# Patient Record
Sex: Male | Born: 1940 | Race: White | Hispanic: No | Marital: Married | Smoking: Former smoker
Health system: Southern US, Community
[De-identification: ages and names within clinical notes are randomized; demographics above are authoritative.]

## PROBLEM LIST (undated history)

## (undated) DIAGNOSIS — D3502 Benign neoplasm of left adrenal gland: Secondary | ICD-10-CM

## (undated) DIAGNOSIS — R918 Other nonspecific abnormal finding of lung field: Secondary | ICD-10-CM

## (undated) DIAGNOSIS — N400 Enlarged prostate without lower urinary tract symptoms: Secondary | ICD-10-CM

## (undated) DIAGNOSIS — I2510 Atherosclerotic heart disease of native coronary artery without angina pectoris: Secondary | ICD-10-CM

## (undated) DIAGNOSIS — I10 Essential (primary) hypertension: Secondary | ICD-10-CM

## (undated) DIAGNOSIS — I639 Cerebral infarction, unspecified: Secondary | ICD-10-CM

## (undated) DIAGNOSIS — C679 Malignant neoplasm of bladder, unspecified: Secondary | ICD-10-CM

## (undated) DIAGNOSIS — E119 Type 2 diabetes mellitus without complications: Secondary | ICD-10-CM

## (undated) DIAGNOSIS — E782 Mixed hyperlipidemia: Secondary | ICD-10-CM

## (undated) HISTORY — DX: Benign prostatic hyperplasia without lower urinary tract symptoms: N40.0

## (undated) HISTORY — DX: Benign neoplasm of left adrenal gland: D35.02

## (undated) HISTORY — DX: Type 2 diabetes mellitus without complications: E11.9

## (undated) HISTORY — PX: ABDOMINAL AORTIC ANEURYSM REPAIR: SUR1152

## (undated) HISTORY — DX: Essential (primary) hypertension: I10

## (undated) HISTORY — PX: HERNIA REPAIR: SHX51

## (undated) HISTORY — DX: Atherosclerotic heart disease of native coronary artery without angina pectoris: I25.10

## (undated) HISTORY — DX: Malignant neoplasm of bladder, unspecified: C67.9

## (undated) HISTORY — DX: Mixed hyperlipidemia: E78.2

## (undated) HISTORY — DX: Cerebral infarction, unspecified: I63.9

## (undated) HISTORY — PX: OTHER SURGICAL HISTORY: SHX169

## (undated) HISTORY — DX: Other nonspecific abnormal finding of lung field: R91.8

## (undated) HISTORY — PX: APPENDECTOMY: SHX54

---

## 2001-10-30 ENCOUNTER — Inpatient Hospital Stay (HOSPITAL_COMMUNITY): Admission: EM | Admit: 2001-10-30 | Discharge: 2001-10-30 | Payer: Self-pay | Admitting: Emergency Medicine

## 2001-10-30 ENCOUNTER — Encounter: Payer: Self-pay | Admitting: Emergency Medicine

## 2006-06-08 ENCOUNTER — Emergency Department (HOSPITAL_COMMUNITY): Admission: EM | Admit: 2006-06-08 | Discharge: 2006-06-08 | Payer: Self-pay | Admitting: Emergency Medicine

## 2011-07-17 HISTORY — PX: CORONARY ARTERY BYPASS GRAFT: SHX141

## 2011-08-14 ENCOUNTER — Ambulatory Visit (HOSPITAL_COMMUNITY)
Admission: RE | Admit: 2011-08-14 | Discharge: 2011-08-14 | Disposition: A | Discharge status: Home / Self Care | Payer: Medicare Other | Source: Ambulatory Visit | Attending: Family Medicine | Admitting: Family Medicine

## 2011-08-14 DIAGNOSIS — R29898 Other symptoms and signs involving the musculoskeletal system: Secondary | ICD-10-CM | POA: Insufficient documentation

## 2011-08-14 DIAGNOSIS — Z5189 Encounter for other specified aftercare: Secondary | ICD-10-CM | POA: Insufficient documentation

## 2011-08-14 DIAGNOSIS — I69998 Other sequelae following unspecified cerebrovascular disease: Secondary | ICD-10-CM | POA: Insufficient documentation

## 2011-08-14 DIAGNOSIS — R262 Difficulty in walking, not elsewhere classified: Secondary | ICD-10-CM | POA: Insufficient documentation

## 2011-08-14 DIAGNOSIS — I69959 Hemiplegia and hemiparesis following unspecified cerebrovascular disease affecting unspecified side: Secondary | ICD-10-CM | POA: Insufficient documentation

## 2011-08-14 DIAGNOSIS — I69919 Unspecified symptoms and signs involving cognitive functions following unspecified cerebrovascular disease: Secondary | ICD-10-CM | POA: Insufficient documentation

## 2011-08-14 DIAGNOSIS — Z951 Presence of aortocoronary bypass graft: Secondary | ICD-10-CM | POA: Insufficient documentation

## 2011-08-14 NOTE — Evaluation (Signed)
Occupational Therapy Evaluation  Patient Details  Name: Allen Mason MRN: [id number] Date of Birth: 1940-08-01  Today's Date: 08/14/2011 Time: 1430-1530 Time Calculation (min): 60 min  Visit#: 1  of 8   Re-eval: 09/11/11  Assessment Diagnosis: CVA s/p CABG, Next MD Visit: 08/15/11 Prior Therapy: Acute PT, OT ST  Past Medical History: No past medical history on file. Past Surgical History: No past surgical history on file.  Subjective Symptoms/Limitations Symptoms: Since coming home from the hospital patient complaining of being very cold, and things do not taste well. Patient now walks with a cane at home and has decreased activitiy tolerance. At times his knees buckle.Patient's left arm is weak and uncoordinated. Limitations: Patient states he had a heart attack and on Jan. 22, 2013 Mr. 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  Patient developed Pleural Effusion with 2 L of fluid drained from a chest tube placed 2/1. Chest tube removed 2/9 and he is going to possibly undergo another aspiration of fluid 08/15/11. Patient seen in acute care 2/8 and home 08/10/11. Pain Assessment Currently in Pain?: No/denies Multiple Pain Sites: No  Precautions/Restrictions  Precautions Precautions: Fall Precaution Comments: can not take tub bath, no raising over 10 lbs, no raising shoulder over head CABG precauttions. Restrictions Weight Bearing Restrictions: No  Prior Functioning  Home Living Lives With: Spouse Receives Help From: Family Type of Home: House Home Layout: Two level;Able to live on main level with bedroom/bathroom Alternate Level Stairs-Rails: Left Alternate Level Stairs-Number of Steps: 5 Home Access: Stairs to enter Entrance Stairs-Rails: Left Entrance Stairs-Number of Steps: 5 Bathroom Shower/Tub: Chiropodist:  Standard Bathroom Accessibility: No Home Adaptive Equipment: None Prior Function Level of Independence: Independent with basic ADLs;Independent with homemaking with ambulation;Independent with gait;Independent with transfers Able to Take Stairs?: Yes Driving: Yes Vocation: Retired Biomedical scientist: retired railroad Leisure: Hobbies-yes (Comment) Comments: Piddling  Assessment ADL/Vision/Perception ADL ADL Comments: Patient has problems reading the paper, Has problems with memory retention of reading, STM problems, Patient very frustrated about his cognitive deficits. Vision - History Baseline Vision: Bifocals Visual History: Cataracts Patient Visual Report: No change from baseline Vision - Assessment Eye Alignment: Within Functional Limits Vision Assessment: Vision not tested Perception Perception: Not tested Praxis Praxis: Intact  Cognition/Observation Cognition Overall Cognitive Status: Appears within functional limits for tasks assessed Arousal/Alertness: Awake/alert Orientation Level: Oriented X4 Attention: Sustained Sustained Attention: Appears intact Memory: Impaired Memory Impairment: Decreased short term memory Decreased Short Term Memory: Functional basic Awareness: Appears intact Problem Solving: Appears intact Behaviors: Poor frustration tolerance Safety/Judgment: Impaired  Sensation/Coordination/Edema Sensation Light Touch: Appears Intact Stereognosis: Appears Intact Hot/Cold: Appears Intact Proprioception: Impaired Detail Coordination Gross Motor Movements are Fluid and Coordinated: No Fine Motor Movements are Fluid and Coordinated: No Coordination and Movement Description: decreased coordnination notes in Left arm Edema Edema: none noted  Additional Assessments RUE Assessment RUE Assessment: Within Functional Limits LUE Assessment LUE Assessment: Exceptions to WFL LUE AROM (degrees) Overall AROM Left Upper Extremity: Within functional  limits for tasks assessed LUE PROM (degrees) Overall PROM Left Upper Extremity: Within functional limits for tasks assessed LUE Strength LUE Overall Strength: Within Functional Limits for tasks assessed Grip (lbs): 58 lbs Lateral Pinch: 12 lbs 3 Point Pinch: 10 lbs  Exercise/Treatments Patient taken through coordinated use of LUE to manipulate putting and strengthen grip and pinch.   Occupational  Therapy Assessment and Plan OT Assessment and Plan Clinical Impression Statement: A: Patient is a good candidate for OT to work on ADL's, fine motor coordination, STM skills and perceptual skills as well as increased activity tolerance. Rehab Potential: Excellent OT Frequency: Min 2X/week OT Duration: 4 weeks OT Treatment/Interventions: Self-care/ADL training;Therapeutic exercise;Therapeutic activities;Manual therapy;Patient/family education OT Plan: P: Skilled OT required to increase FM coordianation, STM for ADL's, Activity tolerance and Safety.   Goals Home Exercise Program Pt will Perform Home Exercise Program: Independently Short Term Goals Time to Complete Short Term Goals: 2 weeks Short Term Goal 1: Patient will demonstrate indepednt use of theraputty to increase grip, pinch and coordination Short Term Goal 2: Patient will given a test of vusual perceptual skills. Short Term Goal 3: Patient will increase LUE Coordnination with ability to throw and catch balls of various sizes, shapes and weight with bilateral and unilateral (LUE) use. Short Term Goal 4: Patient willdemonstrate ability to complete all fasteners independently with use of bilateral hand use. Short Term Goal 5: Patient will be able to hold and read newspaper. Long Term Goals Time to Complete Long Term Goals: 4 weeks Long Term Goal 1: Patient will demonstrate 29 second 9 hold peg test with LUE hand Long Term Goal 2: Patient will be able to return to full use of television including knowing how to utilize readers  guide. Long Term Goal 3: Patient will be able to follow simple sequence for peparation of light meal. Long Term Goal 4: Patient will be able to sustain light physical activity over 30 minutes without a need for a break and no shortness of breath. Long Term Goal 5: Patient will be independent with ADL's IADL's at home with his wife   Problem List There is no problem list on file for this patient.   End of Session Activity Tolerance: Patient tolerated treatment well General Behavior During Session: [facility] for tasks performed Cognition: [facility] for tasks performed    Thomasenia Sales 08/14/2011, 3:51 PM  Physician Documentation Your signature is required to indicate approval of the treatment plan as stated above.  Please sign and either send electronically or make a copy of this report for your files and return this physician signed original.  Please mark one 1.__approve of plan  2. ___approve of plan with the following conditions.   ______________________________                                                          _____________________ Physician Signature                                                                                                             Date

## 2011-08-16 ENCOUNTER — Ambulatory Visit (HOSPITAL_COMMUNITY)
Admission: RE | Admit: 2011-08-16 | Discharge: 2011-08-16 | Disposition: A | Discharge status: Home / Self Care | Payer: Medicare Other | Source: Ambulatory Visit | Attending: Family Medicine | Admitting: Family Medicine

## 2011-08-16 ENCOUNTER — Ambulatory Visit (HOSPITAL_COMMUNITY)
Admission: RE | Admit: 2011-08-16 | Discharge: 2011-08-16 | Disposition: A | Discharge status: Home / Self Care | Payer: Medicare Other | Source: Ambulatory Visit | Attending: *Deleted | Admitting: *Deleted

## 2011-08-16 DIAGNOSIS — R262 Difficulty in walking, not elsewhere classified: Secondary | ICD-10-CM | POA: Insufficient documentation

## 2011-08-16 NOTE — Evaluation (Signed)
Physical Therapy Evaluation  Patient Details  Name: Allen Mason MRN: [id number] Date of Birth: 01/30/1941  Today's Date: 08/16/2011 Time: 1107-1202 Time Calculation (min): 55 min Charges: 1 eval; 10 min TE Visit#: 1  of 4   Re-eval: 09/15/11 Assessment Diagnosis: S/P CABG w/CVA Prior Therapy: inpatient rehab at [facility]  Subjective Symptoms/Limitations Symptoms: Pt is a 71 year old male that is referred to PT secondary to a CVA after sustaining a CABG w/ LLE weakness and fatigue.  His wife is present with him to report an accurate history.  CABG on 07/21/11 and then had a a CVA.  He was admitted to inpatient rehab and was discharged this past weekend.  When he was at home on Saturday he states he was walking outside and tripped over his L foot and fell backwards, but states he does not have any injury other then sore BUE.  He has a visible abrasion on his L forarm.  He reports he is currently functioning at about 60% of what he was a year ago.  Prior to his surgery he reports he enjoyed going to baseball games, working in his shop, able to drive and go out in the community without fatigue.  History of: DM, Heart Problems, hyperlipidema, GERD.   How long can you stand comfortably?: 3 minutes comfortably, reports he could stand longer if he had too.  Pt able to complete most activities today, however by the end of the treatment session he was too fatigued to continue with low level balance exercises.  How long can you walk comfortably?: 3 minutes with a SPC in the community.  He reports that he does not use his cane in his house.   Pain Assessment Currently in Pain?: No/denies  Precautions/Restrictions  Precautions Precaution Comments: NO MODALITIES per MD request  Prior Functioning  Prior Function Able to Take Stairs?: Reciprically Driving: Yes  Sensation/Coordination/Flexibility/Functional Tests Functional Tests Functional Tests: 6 minute walk test: completed throught the hospital  without rest breaks ( over 1000 ft completed).  Able to ascend and descend stairs with recipriocal pattern and 1 handrail.   Assessment RLE Assessment RLE Assessment: Within Functional Limits LLE Strength LLE Overall Strength Comments: 4/5 overall  Palpation Palpation: Moderate atrophy noted to LLE  Mobility/Balance  Ambulation/Gait Ambulation/Gait: Yes Assistive device: Straight cane (Carries in his hand, does not use it) Stairs: Yes Stairs Assistance: 6: Modified independent (Device/Increase time) Stair Management Technique: One rail Left Number of Stairs: 8  Height of Stairs: 6  Posture/Postural Control Posture/Postural Control: No significant limitations Berg Balance Test: 53/56 Sit To Stand: Able to stand without using hands Standing Unsupported: Able to stand safely 2 minutes Sitting with Back Unsupported but Feet Supported on Floor or Stool: Able to sit safely and securely 2 minutes Stand to Sit: Sits safely with minimal use of hands Transfers: Able to transfer safely, minor use of hands Standing Unsupported with Eyes Closed: Able to stand 10 seconds safely Standing Ubsupported with Feet Together: Able to place feet together independently and stand 1 minute safely From Standing, Reach Forward with Outstretched Arm: Can reach confidently >25 cm (10") From Standing Position, Pick up Object from Floor: Able to pick up shoe safely and easily From Standing Position, Turn to Look Behind Over each Shoulder: Looks behind from both sides and weight shifts well Turn 360 Degrees: Able to turn 360 degrees safely in 4 seconds or less Standing Unsupported, Alternately Place Feet on Step/Stool: Able to stand independently and safely and complete 8  steps in 20 seconds Standing Unsupported, One Foot in Front: Able to plae foot ahead of the other independently and hold 30 seconds Standing on One Leg: Able to lift leg independently and hold equal to or more than 3 seconds    Exercise/Treatments Standing Heel Raises: 10 reps;Limitations Heel Raises Limitations: Toe Raises 5x Functional Squat: 10 reps SLS: Education on SLS and Tandem stance for home, pt had decreased energy to continue with these exercises today. Seated Other Seated Knee Exercises: Heel and Toe Roll in and outs 5x5 sec hold eac Supine Hip Adduction Isometric: Right;Left;10 reps;Limitations Hip Adduction Isometric Limitations: SEATED 10 sec hold  Physical Therapy Assessment and Plan PT Assessment and Plan Clinical Impression Statement: Pt is a 71 year old male referred to PT s/p CABG and CVA which left him with L sided weakness and decreased endurance.  His c/co is weakness throughout is LUE and increased fatigue with activities.  After examination it was found that he has current impairments of mild decrease in L LE strength, impaired endurance and decreased upper level balance activities which are limiting his ability to participate in community related activities.  Pt will benefit from 4 visits of skilled OPPT in order to address the above impairments in order to maximize function and reach functional goals.  Rehab Potential: Good PT Frequency: Min 1X/week PT Duration: 4 weeks PT Treatment/Interventions: Gait training;Stair training;Functional mobility training;Therapeutic activities;Therapeutic exercise;Balance training;Patient/family education;Other (comment) (NO MODALITIES per MD) PT Plan: Add: Elliptical trainer for endurance (he has a TM at home).  Heel and Toe walking, 4 Way SLR, Hay Bails.    Goals Home Exercise Program Pt will Perform Home Exercise Program: Independently PT Goal: Perform Home Exercise Program - Progress: Goal set today PT Short Term Goals Time to Complete Short Term Goals: 4 weeks PT Short Term Goal 1: Pt will report ambulating comfortably for 15 minutes in order to participate in community related activities.  PT Short Term Goal 2: Pt will improve his dynamic  balance demonstrate ambulating independently on outdoor surfaces (grass, asphalt and concrete) for 10 minutes to decrease risk of falls when he attends outdoor sporting events.   Problem List Patient Active Problem List  Diagnoses  . Difficulty in walking    PT - End of Session Activity Tolerance: Patient limited by fatigue;Patient tolerated treatment well PT Plan of Care PT Home Exercise Plan: See scanned report.  Also instructed on Walking Program and how to decrease his risks of falls.  Consulted and Agree with Plan of Care: Patient;Family member/caregiver Family Member Consulted: Wife (sandra)  GP  Functional Reporting Modifier  Current Status  [id number] - Mobility: Walking & Moving Around CI - At least 20% but less than 40% impaired, limited or restricted  Goal Status  G8979 - Mobility: [place] & Moving Around CI - At least 1% but less than 20% impaired, limited or restricted  Ratings based on: Pt reports he is only able to comfortably ambulate for 3 minutes and becomes easily fatigued.  As well as MMT (4/5 LLE) and balance findings (Berg: 53/56) and pt report of functioning at 60% of his normal.   Monquie Fulgham 08/16/2011, 12:30 PM  Physician Documentation Your signature is required to indicate approval of the treatment plan as stated above.  Please sign and either send electronically or make a copy of this report for your files and return this physician signed original.   Please mark one 1.__approve of plan  2. ___approve of plan with the following conditions.  ______________________________                                                          _____________________ Physician Signature                                                                                                             Date

## 2011-08-16 NOTE — Evaluation (Signed)
Speech Language Pathology Evaluation Patient Details  Name: Allen Mason MRN: [id number] Date of Birth: October 13, 1940  Today's Date: 08/16/2011 Time: 1005-1040 Time Calculation (min): 35 min  Past Medical History: No past medical history on file. Past Surgical History: No past surgical history on file.  Allen Mason is a 71 year old gentleman who underwent CABG on 07/17/2011 at [facility] in [place] with subsequent CVA with left hemiparesis. He spent time in the ICU, step down unit, and in acute rehab before being discharged home with his wife on 08/10/2011. He has improved tremendously since his stay in the ICU, however his wife reports that he still has some short term memory problems and residual weakness on the left side.  HPI:  Symptoms/Limitations Symptoms: "He has a hard time with memory, but it is getting better." Special Tests: Informal cognitive linguistic measures Pain Assessment Currently in Pain?: No/denies Multiple Pain Sites: No  Prior Functional Status  Cognitive/Linguistic Baseline: Within functional limits Type of Home: House Lives With: Spouse Receives Help From: Family Vocation: Retired (Retired 12 years ago from [employer])  Cognition  Overall Cognitive Status: Impaired Arousal/Alertness: Awake/alert Orientation Level: Oriented to person;Oriented to place;Oriented to situation;Disoriented to time Sustained Attention: Appears intact Memory: Impaired Memory Impairment: Decreased recall of new information;Prospective memory;Decreased short term memory Decreased Short Term Memory: Verbal complex;Functional complex Awareness: Impaired Awareness Impairment: Intellectual impairment (States that he is not aware of memory problems if he has the) Problem Solving: Appears intact Executive Function: Self Monitoring Self Monitoring: Impaired Self Monitoring Impairment: Functional complex Safety/Judgment: Impaired Comments: Acknowledges that he  is not allowed to drive now, but unable to state why.   Comprehension  Auditory Comprehension Overall Auditory Comprehension: Appears within functional limits for tasks assessed Yes/No Questions: Within Functional Limits Commands: Within Functional Limits Conversation: Complex Interfering Components: Working Marine scientist (Will assess further) EffectiveTechniques: Repetition Environmental consultant Discrimination: Not tested Reading Comprehension Reading Status: Not tested (Pt states that reading is fine, but unable to hold newspaper)  Expression  Expression Primary Mode of Expression: Verbal Verbal Expression Overall Verbal Expression: Appears within functional limits for tasks assessed Initiation: No impairment Repetition: No impairment (possibly with lengthy information due to STM) Naming: No impairment Pragmatics: No impairment Non-Verbal Means of Communication: Not applicable Written Expression Dominant Hand: Right Written Expression: Not tested  Oral/Motor  Oral Motor/Sensory Function Overall Oral Motor/Sensory Function: Appears within functional limits for tasks assessed Motor Speech Overall Motor Speech: Appears within functional limits for tasks assessed  SLP Goals  Home Exercise SLP Goal: Patient will Perform Home Exercise Program: Independently SLP Short Term Goals SLP Short Term Goal 1: Pt will identify at least 3 memory strategies that are effective for him to increase recall of functional information with indirect cues. SLP Short Term Goal 2: Pt will utilize appropriate memory strategies to increase recall of functional information in treatment tasks to 100% with set-up cues. SLP Short Term Goal 3: Pt will identify situations in which he may face difficulties (due to impaired working memory) and provide a logical alternative/solution with min assist.  SLP Short Term Goal 4: Pt will complete 3 prospective memory tasks with 100 % accuracy and initial cue  provided daily (at home or in treatment).  SLP Long Term Goals SLP Long Term Goal 1: Same as short term goals.  Assessment/Plan  There is no problem list on file for this patient.  SLP - End of Session Activity Tolerance: Patient tolerated treatment well General Behavior During Session: [facility]  for tasks performed Cognition: [facility] for tasks performed  SLP Assessment/Plan Clinical Impression Statement: Pt presents with mild working and short term memory deficits which negatively impact his overall awareness/insight and safety to be independent in the community. He is currently unable to manage his medications independently and relies on assist from family (previously independent). Speech Therapy Frequency: min 2x/week Duration: 2 weeks Treatment/Interventions: Compensatory strategies;Functional tasks;Cueing hierarchy;SLP instruction and feedback;Patient/family education;Cognitive reorganization Potential to Achieve Goals: Good  GN  Functional Reporting Modifier  Current Status  G9168 - Memory CJ - At least 20% but less than 40% impaired, limited or restricted  Goal Status   G9169 - Memory CI - At least 1% but less than 20% impaired, limited or restricted   Thank you for this referral,   Genene Churn, [place]  [place] 08/16/2011, 12:23 PM  Physician Documentation Your signature is required to indicate approval of the treatment plan as stated above.  Please sign and either send electronically or make a copy of this report for your files and return this physician signed original.  Please mark one 1.__approve of plan  2. ___approve of plan with the following conditions.   ______________________________                                                          _____________________ Physician Signature                                                                                                             Date

## 2011-08-21 ENCOUNTER — Ambulatory Visit (HOSPITAL_COMMUNITY): Payer: Self-pay | Admitting: *Deleted

## 2011-08-21 ENCOUNTER — Inpatient Hospital Stay (HOSPITAL_COMMUNITY): Admission: RE | Admit: 2011-08-21 | Payer: Self-pay | Source: Ambulatory Visit | Admitting: Speech Pathology

## 2011-08-21 ENCOUNTER — Telehealth (HOSPITAL_COMMUNITY): Payer: Self-pay

## 2011-08-23 ENCOUNTER — Ambulatory Visit (HOSPITAL_COMMUNITY): Payer: Self-pay | Admitting: Speech Pathology

## 2011-08-28 ENCOUNTER — Ambulatory Visit (HOSPITAL_COMMUNITY)
Admission: RE | Admit: 2011-08-28 | Discharge: 2011-08-28 | Disposition: A | Discharge status: Home / Self Care | Payer: Medicare Other | Source: Ambulatory Visit | Attending: Family Medicine | Admitting: Family Medicine

## 2011-08-28 DIAGNOSIS — R29898 Other symptoms and signs involving the musculoskeletal system: Secondary | ICD-10-CM | POA: Insufficient documentation

## 2011-08-28 DIAGNOSIS — I69998 Other sequelae following unspecified cerebrovascular disease: Secondary | ICD-10-CM | POA: Insufficient documentation

## 2011-08-28 DIAGNOSIS — Z951 Presence of aortocoronary bypass graft: Secondary | ICD-10-CM | POA: Insufficient documentation

## 2011-08-28 DIAGNOSIS — Z5189 Encounter for other specified aftercare: Secondary | ICD-10-CM | POA: Insufficient documentation

## 2011-08-28 DIAGNOSIS — R262 Difficulty in walking, not elsewhere classified: Secondary | ICD-10-CM | POA: Insufficient documentation

## 2011-08-28 DIAGNOSIS — I69919 Unspecified symptoms and signs involving cognitive functions following unspecified cerebrovascular disease: Secondary | ICD-10-CM | POA: Insufficient documentation

## 2011-08-28 DIAGNOSIS — I69959 Hemiplegia and hemiparesis following unspecified cerebrovascular disease affecting unspecified side: Secondary | ICD-10-CM | POA: Insufficient documentation

## 2011-08-28 NOTE — Progress Notes (Signed)
Occupational Therapy Treatment  Patient Details  Name: Allen Mason MRN: [id number] Date of Birth: 01/11/1941  Today's Date: 08/28/2011 Time: [id number] Time Calculation (min): 36 min  Visit#: 2  of 8   Re-eval: 09/11/11 Neuro re-ed [id number] 36'    Subjective Symptoms/Limitations Symptoms: OT: S: I just want my hand back. Pain Assessment Currently in Pain?: No/denies  Precautions/Restrictions     Exercise/Treatments   ROM / Strengthening / Isometric Strengthening Rebounder: with soccer ball UBE (Upper Arm Bike): 3' forward 3' backwards Wall Wash: 2' Wall Pushups: 10 reps Wrist Exercises     Sponges: 20,21 Theraputty - Flatten: green Theraputty - Roll: green Theraputty - Grip: green Theraputty - Pinch: green Hand Gripper with Large Beads: x 10  Hand Exercises Theraputty - Flatten: green Theraputty - Roll: green Theraputty - Grip: green Theraputty - Pinch: green Hand Gripper with Large Beads: x 10 Sponges: 20,21 Fine Motor Coordination: Rubberbands Rubberbands: wrapped around each finger  Neurological Re-education Exercises Sponges: 20,21 Hand Gripper with Large Beads: x 10  Weight Bearing Exercises Weight Bearing Position: Standing Standing with weight shifting on and off: with putty.  Grasp and Release Grasp and Release: Theraputty Theraputty - Flatten: green Theraputty - Roll: green Theraputty - Grip: green Theraputty - Pinch: green  Fine Motor Coordination Fine Motor Coordination: Rubberbands Rubberbands: wrapped around each finger        Occupational Therapy Assessment and Plan OT Assessment and Plan Clinical Impression Statement: A:  Added multiple new ex which patient tolerated well.  Gave patient homework to use his Wii at home to increase his gross motor coordination. Rehab Potential: Excellent OT Plan: P:  Continue to increase gross and fine motor coordination.   Goals Home Exercise Program Pt will Perform Home Exercise Program:  Independently Short Term Goals Time to Complete Short Term Goals: 2 weeks Short Term Goal 1: Patient will demonstrate indepednt use of theraputty to increase grip, pinch and coordination Short Term Goal 2: Patient will given a test of vusual perceptual skills. Short Term Goal 3: Patient will increase LUE Coordnination with ability to throw and catch balls of various sizes, shapes and weight with bilateral and unilateral (LUE) use. Short Term Goal 4: Patient willdemonstrate ability to complete all fasteners independently with use of bilateral hand use. Short Term Goal 5: Patient will be able to hold and read newspaper. Long Term Goals Time to Complete Long Term Goals: 4 weeks Long Term Goal 1: Patient will demonstrate 29 second 9 hold peg test with LUE hand Long Term Goal 2: Patient will be able to return to full use of television including knowing how to utilize readers guide. Long Term Goal 3: Patient will be able to follow simple sequence for peparation of light meal. Long Term Goal 4: Patient will be able to sustain light physical activity over 30 minutes without a need for a break and no shortness of breath. Long Term Goal 5: Patient will be independent with ADL's IADL's at home with his wife   Problem List Patient Active Problem List  Diagnoses  . Difficulty in walking    End of Session Activity Tolerance: Patient tolerated treatment well General Behavior During Session: [facility] for tasks performed Cognition: [facility] for tasks performed  GO No functional reporting required   Kden Wagster L. Thera Flake, COTA/L  08/28/2011, 2:21 PM

## 2011-08-28 NOTE — Progress Notes (Signed)
Speech Language Pathology Treatment Patient Details  Name: Allen Mason MRN: [id number] Date of Birth: [birth year] Visit #: 2  Today's Date: 08/28/2011 Time: 1030-1130 Time Calculation (min): 60 min  HPI:  Symptoms/Limitations Symptoms: "I am feeling pretty good... it's just the arm!" Pain Assessment Currently in Pain?: No/denies Multiple Pain Sites: No   Treatment  Cognitive-Linguistic Therapy Patient/Family Education Home Exercise Program  SLP Goals  Home Exercise SLP Goal: Patient will Perform Home Exercise Program: Independently SLP Goal: Perform Home Exercise Program - Progress: Progressing toward goal SLP Short Term Goals SLP Short Term Goal 1: Pt will identify at least 3 memory strategies that are effective for him to increase recall of functional information with indirect cues. SLP Short Term Goal 1 - Progress: Progressing toward goal SLP Short Term Goal 2: Pt will utilize appropriate memory strategies to increase recall of functional information in treatment tasks to 100% with set-up cues. SLP Short Term Goal 2 - Progress: Progressing toward goal SLP Short Term Goal 3: Pt will identify situations in which he may face difficulties (due to impaired working memory) and provide a logical alternative/solution with min assist.  SLP Short Term Goal 3 - Progress: Progressing toward goal SLP Short Term Goal 4: Pt will complete 3 prospective memory tasks with 100 % accuracy and initial cue provided daily (at home or in treatment).  SLP Short Term Goal 4 - Progress: Progressing toward goal SLP Long Term Goals SLP Long Term Goal 1: Same as short term goals.  Assessment/Plan  Patient Active Problem List  Diagnoses  . Difficulty in walking   SLP - End of Session Activity Tolerance: Patient tolerated treatment well General Behavior During Session: [facility] for tasks performed Cognition: [facility] for tasks performed  SLP Assessment/Plan Clinical Impression Statement: Pt was  accompanied to appointment by his wife after his other therapy sessions. His wife reports that she can see that his memory is improving at home, but that he continues to depend on her for most care (food, medications, appointments, etc). He is dissapointed in his inability to drive at this time and feels that he would be fine to do so. He will talk to his doctor about this March 20. Memory exercises were completed today, initially without cuing for strategies and again with use of strategies. He stated that he does not need to use strategies, but his performance shows dramatic improvement when cued to use strategies. When given a list of 10 words and prompted with cues to recall, he got 5/10, then 7/10, 7/10, and 10/10 on the fourth reading. When cued to visualize word and use in short sentence with the next set of words, he got 7/10 on the first trial and 10/10 on the second. He continues to stress that his main priority is improving strength in his left arm.  Speech Therapy Frequency: min 2x/week Duration: 1 week Treatment/Interventions: Compensatory strategies;Functional tasks;Cueing hierarchy;SLP instruction and feedback;Patient/family education;Cognitive reorganization Potential to Achieve Goals: Good  GN No functional reporting required  Anyla Israelson 08/28/2011, 12:17 PM

## 2011-08-28 NOTE — Progress Notes (Signed)
Physical Therapy Treatment Patient Details  Name: LARAMY TREICHEL MRN: [id number] Date of Birth: 1940-11-19  Today's Date: 08/28/2011 Time: [id number] Time Calculation (min): 40 min Visit#: 2  of 4   Re-eval: 09/14/11 Charges:  therex 15', NMR 10'    Subjective: Symptoms/Limitations Symptoms: Pt. states he's doing good today.  States his R hand gives him the most difficulty.  Now walking without an AD. Pain Assessment Currently in Pain?: No/denies   Exercise/Treatments Aerobic Elliptical: 2' Plyometrics Other Plyometric Exercises: rebounder with green ball L only throwing catch B Standing Heel Raises: 15 reps Heel Raises Limitations: Toe Raises 15x Functional Squat: 15 reps SLS: L SLS max of 4 12" Other Standing Knee Exercises: heel walk/ toe walk 1RT each Other Standing Knee Exercises: hip abduction/extension 10 reps each   Physical Therapy Assessment and Plan PT Assessment and Plan Clinical Impression Statement: Added new exercises without difficullty; Pt. able to complete all balance activities with minimal LOB.  Pt. had one episode of dizziness but able to subside without with rest.     Plan:  Add higher level balance activities  Problem List Patient Active Problem List  Diagnoses  . Difficulty in walking    PT - End of Session Activity Tolerance: Patient tolerated treatment well General Behavior During Session: [facility] for tasks performed Cognition: [facility] for tasks performed PT Plan of Care PT Home Exercise Plan: Add higher level balance activities next visit.   Emmersyn Kratzke B. Mare Ferrari, PTA 08/28/2011, 10:16 AM

## 2011-08-30 ENCOUNTER — Ambulatory Visit (HOSPITAL_COMMUNITY)
Admission: RE | Admit: 2011-08-30 | Discharge: 2011-08-30 | Disposition: A | Discharge status: Home / Self Care | Payer: Medicare Other | Source: Ambulatory Visit | Attending: Family Medicine | Admitting: Family Medicine

## 2011-08-30 ENCOUNTER — Ambulatory Visit (HOSPITAL_COMMUNITY): Payer: Self-pay | Admitting: Speech Pathology

## 2011-08-30 NOTE — Progress Notes (Signed)
Speech Language Pathology Treatment/Discharge Summary Patient Details  Name: Allen Mason MRN: [id number] Date of Birth: [birth year] Visit #: 3  Today's Date: 08/30/2011 Time: [id number] Time Calculation (min): 45 min  HPI:  Symptoms/Limitations Symptoms: "I am doing fine." Pain Assessment Currently in Pain?: No/denies   Treatment  Cognitive-Linguistic Therapy Patient/Family Education Home Exercise Program  SLP Goals  Home Exercise SLP Goal: Patient will Perform Home Exercise Program: Independently SLP Goal: Perform Home Exercise Program - Progress: Met SLP Short Term Goals SLP Short Term Goal 1: Pt will identify at least 3 memory strategies that are effective for him to increase recall of functional information with indirect cues. SLP Short Term Goal 1 - Progress: Met SLP Short Term Goal 2: Pt will utilize appropriate memory strategies to increase recall of functional information in treatment tasks to 100% with set-up cues. SLP Short Term Goal 2 - Progress: Met SLP Short Term Goal 3: Pt will identify situations in which he may face difficulties (due to impaired working memory) and provide a logical alternative/solution with min assist.  SLP Short Term Goal 3 - Progress: Met SLP Short Term Goal 4: Pt will complete 3 prospective memory tasks with 100 % accuracy and initial cue provided daily (at home or in treatment).  SLP Short Term Goal 4 - Progress: Met SLP Long Term Goals SLP Long Term Goal 1: Same as short term goals.  Assessment/Plan  Patient Active Problem List  Diagnoses  . Difficulty in walking   SLP - End of Session Activity Tolerance: Patient tolerated treatment well General Behavior During Session: [facility] for tasks performed Cognition: [facility] for tasks performed  SLP Assessment/Plan Clinical Impression Statement: Pt was accompanied to therapy with his wife. He and his wife were discussing the memory strategies we practiced on Tuesday and she stated that he would  benefit from using those strategies at home. She emphasized that if he is interested in something, he will remember it better. He was given memory strategies on paper to take home. While he relies on his wife to remember most things for him, he is able to do much on his own and showed that his recall improves when he uses strategies. No futher speech therapy warranted at this time. Pt has met all goals. Speech Therapy Frequency: Other (Comment) Duration: Other (comment) (discharge from therapy) Treatment/Interventions: Compensatory strategies;Functional tasks;Cueing hierarchy;SLP instruction and feedback;Patient/family education;Cognitive reorganization Potential to Achieve Goals: Good  GN D/C Status  G9170 - Memory CI - At least 1% but less than 20% impaired, limited or restricted    Allen Mason 08/30/2011, 2:51 PM

## 2011-08-30 NOTE — Progress Notes (Signed)
Occupational Therapy Treatment  Patient Details  Name: Allen Mason MRN: [id number] Date of Birth: 27-Jun-1940  Today's Date: 08/30/2011 Time: [id number] Time Calculation (min): 48 min  Visit#: 3  of 8   Re-eval: 09/11/11 Neuro re-ed [id number] 48'    Subjective Symptoms/Limitations Symptoms: OT:  My shoulder was a little sore today but that is good. Pain Assessment Currently in Pain?: No/denies  Precautions/Restrictions     Exercise/Treatments Supine   Seated Extension: Theraband;10 reps Theraband Level (Shoulder Extension): Level 3 (Green) Retraction: Theraband;10 reps Theraband Level (Shoulder Retraction): Level 3 (Green) Row: Theraband;10 reps Theraband Level (Shoulder Row): Level 3 (Green) Protraction: AROM;10 reps Horizontal ABduction: AROM;10 reps External Rotation: AROM;10 reps Internal Rotation: AROM;10 reps Flexion: AROM;10 reps Abduction: AROM;10 reps Therapy Ball Flexion: 10 reps ABduction: 10 reps ROM / Strengthening / Isometric Strengthening UBE (Upper Arm Bike): 3' forward 3' backwards 2.0 Wall Wash: 3' Wall Pushups: 15 reps      Occupational Therapy Assessment and Plan OT Assessment and Plan Clinical Impression Statement: A: Added overhead reach with pegs.  Occasional loss of grip of peg. OT Plan: P: Add weight to shoulder strengthening and tband.   Goals Home Exercise Program Pt will Perform Home Exercise Program: Independently Short Term Goals Time to Complete Short Term Goals: 2 weeks Short Term Goal 1: Patient will demonstrate indepednt use of theraputty to increase grip, pinch and coordination Short Term Goal 2: Patient will given a test of vusual perceptual skills. Short Term Goal 3: Patient will increase LUE Coordnination with ability to throw and catch balls of various sizes, shapes and weight with bilateral and unilateral (LUE) use. Short Term Goal 4: Patient willdemonstrate ability to complete all fasteners independently with use  of bilateral hand use. Short Term Goal 5: Patient will be able to hold and read newspaper. Long Term Goals Time to Complete Long Term Goals: 4 weeks Long Term Goal 1: Patient will demonstrate 29 second 9 hold peg test with LUE hand Long Term Goal 2: Patient will be able to return to full use of television including knowing how to utilize readers guide. Long Term Goal 3: Patient will be able to follow simple sequence for peparation of light meal. Long Term Goal 4: Patient will be able to sustain light physical activity over 30 minutes without a need for a break and no shortness of breath. Long Term Goal 5: Patient will be independent with ADL's IADL's at home with his wife   Problem List Patient Active Problem List  Diagnoses  . Difficulty in walking    End of Session Activity Tolerance: Patient tolerated treatment well General Behavior During Session: [facility] for tasks performed Cognition: [facility] for tasks performed  GO No functional reporting required   Brennley Curtice L. Thera Flake, COTA/L  08/30/2011, 6:24 PM

## 2011-09-04 ENCOUNTER — Ambulatory Visit (HOSPITAL_COMMUNITY): Payer: Self-pay | Admitting: Speech Pathology

## 2011-09-04 ENCOUNTER — Ambulatory Visit (HOSPITAL_COMMUNITY)
Admission: RE | Admit: 2011-09-04 | Discharge: 2011-09-04 | Disposition: A | Discharge status: Home / Self Care | Payer: Medicare Other | Source: Ambulatory Visit | Attending: Family Medicine | Admitting: Family Medicine

## 2011-09-04 NOTE — Progress Notes (Signed)
Occupational Therapy Treatment  Patient Details  Name: Allen Mason MRN: [id number] Date of Birth: July 05, 1940  Today's Date: 09/04/2011 Time: [id number] Time Calculation (min): 54 min  Visit#: 4  of 8   Re-eval: 09/11/11 Neuro re-ed [id number] 54'     Subjective Symptoms/Limitations Symptoms: Pt. states he's OK today; no complaints of pain today. Pain Assessment Currently in Pain?: No/denies  Precautions/Restrictions     Exercise/Treatments    09/04/11 1052  Shoulder Exercises: Seated  Protraction Strengthening;10 reps  Protraction Weight (lbs) 1#  Horizontal ABduction Strengthening;10 reps  Horizontal ABduction Weight (lbs) 1#  External Rotation Strengthening;10 reps  External Rotation Weight (lbs) 1#  Internal Rotation Strengthening;10 reps  Internal Rotation Weight (lbs) 1#  Flexion Strengthening;10 reps  Flexion Weight (lbs) 1#  Abduction Strengthening;10 reps  ABduction Weight (lbs) 1#  Shoulder Exercises: ROM/Strengthening  UBE (Upper Arm Bike) 3' forward 3' backwards 2.0  Wall Wash 3'  Wall Pushups 15 reps  Proximal Shoulder Strengthening, Seated x15         Neurological Re-education  09/04/11 1200  Neurological Re-education Exercises  Elbow Flexion Strengthening;10 reps  Elbow Extension Strengthening;10 reps  Wrist Flexion Strengthening;10 reps  Wrist Extension Strengthening;10 reps  Hand Gripper with Small Beads x10  Weight Bearing Exercises  Weight Bearing Position Standing  Standing with weight shifting on and off with putty.    Occupational Therapy Assessment and Plan OT Assessment and Plan Clinical Impression Statement: OT:A: Added 1# to shoulder strengthening. Rehab Potential: Excellent OT Plan: OT:P:  att fine motor coordination.   Goals Home Exercise Program Pt will Perform Home Exercise Program: Independently Short Term Goals Time to Complete Short Term Goals: 2 weeks Short Term Goal 1: Patient will demonstrate indepednt  use of theraputty to increase grip, pinch and coordination Short Term Goal 2: Patient will given a test of vusual perceptual skills. Short Term Goal 3: Patient will increase LUE Coordnination with ability to throw and catch balls of various sizes, shapes and weight with bilateral and unilateral (LUE) use. Short Term Goal 4: Patient willdemonstrate ability to complete all fasteners independently with use of bilateral hand use. Short Term Goal 5: Patient will be able to hold and read newspaper. Long Term Goals Time to Complete Long Term Goals: 4 weeks Long Term Goal 1: Patient will demonstrate 29 second 9 hold peg test with LUE hand Long Term Goal 2: Patient will be able to return to full use of television including knowing how to utilize readers guide. Long Term Goal 3: Patient will be able to follow simple sequence for peparation of light meal. Long Term Goal 4: Patient will be able to sustain light physical activity over 30 minutes without a need for a break and no shortness of breath. Long Term Goal 5: Patient will be independent with ADL's IADL's at home with his wife   Problem List Patient Active Problem List  Diagnoses  . Difficulty in walking    End of Session Activity Tolerance: Patient tolerated treatment well General Behavior During Session: [facility] for tasks performed Cognition: [facility] for tasks performed  GO No functional reporting required  Rishawn Walck L. Thera Flake, COTA/L  09/04/2011, 6:02 PM

## 2011-09-04 NOTE — Progress Notes (Signed)
Physical Therapy Treatment Patient Details  Name: Allen Mason MRN: [id number] Date of Birth: 11-14-1940  Today's Date: 09/04/2011 Time: 1110-1145 Time Calculation (min): 35 min Visit#: 3  of 4   Re-eval: 09/14/11 Charges:  NMR 20', therex 10'    Subjective: Symptoms/Limitations Symptoms: Pt. states he's OK today; no complaints of pain today. Pain Assessment Currently in Pain?: No/denies   Exercise/Treatments Machine Exercises Elliptical: 2' Stationary Bike: 6'@3 .0 Aerobic Stationary Bike: 6'@3 .0 Elliptical: 2' Standing Heel Raises: 15 reps Heel Raises Limitations: Toe Raises 15x SLS: L SLS max of 3  20" Other Standing Knee Exercises: heel walk/ toe walk 1RT each Other Standing Knee Exercises: tandem, retro, sidestep, balance beam 2RT each Supine Straight Leg Raises: 10 reps;Both Sidelying Hip ABduction: 10 reps;Both Hip ADduction: 10 reps;Both Prone  Hip Extension: 10 reps;Both Other Prone Exercises: Quadruped 5 reps alt UE; 5 reps alt LE      Physical Therapy Assessment and Plan PT Assessment and Plan Clinical Impression Statement: Pt. able to complete all new tasks without difficulty or LOB.  Balance beam and quadruped most challenging but did not require assist.  Pt. with noted weakness in LE's perfoming SLR's in all planes. PT Plan: Re-evalulate next visit per POC.     Problem List Patient Active Problem List  Diagnoses  . Difficulty in walking    PT - End of Session Equipment Utilized During Treatment: Gait belt Activity Tolerance: Patient tolerated treatment well General Behavior During Session: [facility] for tasks performed Cognition: [facility] for tasks performed   Amy B. Mare Ferrari, PTA 09/04/2011, 12:06 PM

## 2011-09-06 ENCOUNTER — Ambulatory Visit (HOSPITAL_COMMUNITY)
Admission: RE | Admit: 2011-09-06 | Discharge: 2011-09-06 | Disposition: A | Discharge status: Home / Self Care | Payer: Medicare Other | Source: Ambulatory Visit | Attending: Family Medicine | Admitting: Family Medicine

## 2011-09-06 ENCOUNTER — Ambulatory Visit (HOSPITAL_COMMUNITY): Payer: Self-pay | Admitting: Speech Pathology

## 2011-09-06 ENCOUNTER — Ambulatory Visit (HOSPITAL_COMMUNITY): Payer: Self-pay | Admitting: Occupational Therapy

## 2011-09-06 NOTE — Progress Notes (Signed)
Occupational Therapy Treatment  Patient Details  Name: Allen Mason MRN: [id number] Date of Birth: 1940/11/22  Today's Date: 09/06/2011 Time: [id number] Time Calculation (min): 48 min  Visit#: 5  of 8   Re-eval: 09/11/11 Neuro re-ed  [id number]'    Subjective Symptoms/Limitations Symptoms: OT: S:  I didn't do anything yesterday, I was so tired.  Precautions/Restrictions     Exercise/Treatments    Seated Extension: Theraband;10 reps Theraband Level (Shoulder Extension): Level 3 (Green) Retraction: Theraband;10 reps Theraband Level (Shoulder Retraction): Level 3 (Green) Row: Theraband;10 reps Theraband Level (Shoulder Row): Level 3 (Green) ROM / Strengthening / Isometric Strengthening UBE (Upper Arm Bike): 3' forward 3' backwards 2.0     Hand Exercises Theraputty - Flatten: blue Theraputty - Roll: blue Theraputty - Grip: blue  Theraputty - Pinch: blue Theraputty - Locate Pegs: x10 Fine Motor Coordination: In hand manipuation training (shuffeling cards) In Hand Manipulation Training: with beeds and key pegboard.  Neurological Re-education Exercises    Weight Bearing Exercises Weight Bearing Position: Standing Standing with weight shifting on and off: with putty.  Development of Reach     Grasp and Release Grasp and Release: Theraputty Theraputty - Flatten: blue Theraputty - Roll: blue Theraputty - Grip: blue  Theraputty - Pinch: blue Theraputty - Locate Pegs: x10  Fine Motor Coordination Fine Motor Coordination: In hand manipuation training (shuffeling cards) In Hand Manipulation Training: with beeds and key pegboard.        Occupational Therapy Assessment and Plan OT Assessment and Plan Clinical Impression Statement: OT:A:  Grooved board moderately difficult.  Patient states he can now button vertical buttons but not horz. buttons like the ones on his jeans. Rehab Potential: Excellent OT Plan: OT: P:  Continue to increase fine motor control for  increased ease with buttons.   Goals Home Exercise Program Pt will Perform Home Exercise Program: Independently Short Term Goals Time to Complete Short Term Goals: 2 weeks Short Term Goal 1: Patient will demonstrate indepednt use of theraputty to increase grip, pinch and coordination Short Term Goal 2: Patient will given a test of vusual perceptual skills. Short Term Goal 3: Patient will increase LUE Coordnination with ability to throw and catch balls of various sizes, shapes and weight with bilateral and unilateral (LUE) use. Short Term Goal 4: Patient willdemonstrate ability to complete all fasteners independently with use of bilateral hand use. Short Term Goal 5: Patient will be able to hold and read newspaper. Long Term Goals Time to Complete Long Term Goals: 4 weeks Long Term Goal 1: Patient will demonstrate 29 second 9 hold peg test with LUE hand Long Term Goal 2: Patient will be able to return to full use of television including knowing how to utilize readers guide. Long Term Goal 3: Patient will be able to follow simple sequence for peparation of light meal. Long Term Goal 4: Patient will be able to sustain light physical activity over 30 minutes without a need for a break and no shortness of breath. Long Term Goal 5: Patient will be independent with ADL's IADL's at home with his wife   Problem List Patient Active Problem List  Diagnoses  . Difficulty in walking    End of Session Activity Tolerance: Patient tolerated treatment well General Behavior During Session: [facility] for tasks performed Cognition: [facility] for tasks performed  GO No functional reporting required  Danice Dippolito L. Dalan Cowger, COTA/L   09/06/2011, 11:49 AM

## 2011-09-11 ENCOUNTER — Ambulatory Visit (HOSPITAL_COMMUNITY)
Admission: RE | Admit: 2011-09-11 | Discharge: 2011-09-11 | Disposition: A | Discharge status: Home / Self Care | Payer: Medicare Other | Source: Ambulatory Visit | Attending: Family Medicine | Admitting: Family Medicine

## 2011-09-11 ENCOUNTER — Ambulatory Visit (HOSPITAL_COMMUNITY): Payer: Self-pay | Admitting: Speech Pathology

## 2011-09-11 NOTE — Progress Notes (Signed)
Physical Therapy Treatment Patient Details  Name: Allen Mason MRN: [id number] Date of Birth: 16-Dec-1940  Today's Date: 09/11/2011 Time: [id number] Time Calculation (min): 30 min Charges: 10' TE, 15' PPT, 5' Selfcare  Visit#: 4  of 4   Re-eval: 09/11/11    Subjective: Symptoms/Limitations Symptoms: PT: Pt reports that he is still getting tired when get goes to his sporting events.  He states that he is up to walking 8 minutes on the TM 1x a day.  His wife states that he was walking 3 minutes 3x /day and has backed down to just 1x a day.  He is going to at least 2 ball games a week and is able to carry his chair to and from the car, however continues to complain of SOB.  He will visit cardiac surgeon tomorrow and believes that he will be referred to cardiac rehab to continue with his endurance activities.   Pain Assessment Currently in Pain?: No/denies  Exercise/Treatments  Berg Balance Test Sit to Stand: Able to stand without using hands and stabilize independently Standing Unsupported: Able to stand safely 2 minutes Sitting with Back Unsupported but Feet Supported on Floor or Stool: Able to sit safely and securely 2 minutes Stand to Sit: Sits safely with minimal use of hands Transfers: Able to transfer safely, minor use of hands Standing Unsupported with Eyes Closed: Able to stand 10 seconds safely Standing Ubsupported with Feet Together: Able to place feet together independently and stand 1 minute safely From Standing, Reach Forward with Outstretched Arm: Can reach confidently >25 cm (10") From Standing Position, Pick up Object from Floor: Able to pick up shoe safely and easily From Standing Position, Turn to Look Behind Over each Shoulder: Looks behind from both sides and weight shifts well Turn 360 Degrees: Able to turn 360 degrees safely in 4 seconds or less Standing Unsupported, Alternately Place Feet on Step/Stool: Able to stand independently and safely and complete 8 steps  in 20 seconds Standing Unsupported, One Foot in Front: Able to place foot tandem independently and hold 30 seconds Standing on One Leg: Able to lift leg independently and hold 5-10 seconds Total Score: 55  Aerobic Tread Mill: 10' @ 1.5 MPH w/BUE support  Physical Therapy Assessment and Plan PT Assessment and Plan Clinical Impression Statement: [name] has attended 4 OP PT visits and has met 2 of 3 STG and is working on independence with his HEP (initally was completing and then reports he just has become lazy).  He currently is attending outdoor sporting events with his wife, however continues to be SOB by the end of the games.  Speaking with the COTA, he has made functional gains with his LUE, however continues to have difficulty with fine motor skills, but has improved gross motor movement and strength. He is visiting his cardiac surgeon tomorrow and is hoping for a referral to cardiac rehab to continue to improve his overall endurance.  recommend continuing with his HEP (including a walking program) to improve his overall endurance and UE strengthening (which has been given by OT).  recommend referral to cardiac rehab to continue with further endurance training.  PT Plan: D/C with education to continue with his walking program.     Goals Home Exercise Program PT Goal: Perform Home Exercise Program - Progress: Partly met PT Short Term Goals PT Short Term Goal 1: Pt will report ambulating comfortably for 15 minutes in order to participate in community related activities PT Short Term Goal 1 -  Progress: Met PT Short Term Goal 2: Pt will improve his dynamic balance demonstrate ambulating independently on outdoor surfaces (grass, asphalt and concrete) for 10 minutes to decrease risk of falls when he attends outdoor sporting events. PT Short Term Goal 2 - Progress: Met  Problem List Patient Active Problem List  Diagnoses  . Difficulty in walking    PT Plan of Care PT Home Exercise Plan:  Thouroughly educated patient on importance of continuing with walking program.  Consulted and Agree with Plan of Care: Patient Family Member Consulted: wife  GP  D/C Status  G8980 - Mobility: [place] Around / CI - At least 1% but less than 20% impaired, limited or restricted CI - At least 1% but less than 20% impaired, limited or restricted     Allen Mason 09/11/2011, 11:55 AM

## 2011-09-11 NOTE — Progress Notes (Signed)
Occupational Therapy Treatment  Patient Details  Name: Allen Mason MRN: [id number] Date of Birth: 04/09/1941  Today's Date: 09/11/2011 Time: 1022-1114 Time Calculation (min): 52 min  Visit#: 6  of 8   Re-eval: 10/09/11 Neuro re-ed [id number]    Subjective Symptoms/Limitations Symptoms: OT: S:  I just want this hand to work better, it is still so uncoordinated. Pain Assessment Currently in Pain?: No/denies  Precautions/Restrictions     Exercise/Treatments  Neurological Re-education Exercises Shoulder Flexion: Strengthening;15 reps Bar Weights/Barbell (Shoulder Flexion): 3 lbs Shoulder ABduction: Strengthening;15 reps Bar Weights/Barbell (Shoulder Abduction): 3 lbs Shoulder Protraction: Strengthening;15 reps Bar Weights/Barbell (Shoulder Protraction): 3 lbs Shoulder Horizontal ABduction: Strengthening;15 reps Bar Weights/Barbell (Shoulder Horizontal Abduction): 3 lbs Shoulder External Rotation: Strengthening;15 reps Bar Weights/Barbell (Shoulder External Rotation): 3 lbs Shoulder Internal Rotation: Strengthening;15 reps Bar Weights/Barbell (Shoulder Internal Rotation): 3 lbs Sponges: 25,28  Weight Bearing Exercises Weight Bearing Position: Standing Standing with weight shifting on and off: with putty.  Development of Reach     Grasp and Release Grasp and Release: Theraputty Theraputty - Flatten: blue Theraputty - Roll: blue Theraputty - Grip: blue  Theraputty - Pinch: blue Theraputty - Locate Pegs: x10  Fine Motor Coordination          Occupational Therapy Assessment and Plan OT Assessment and Plan Clinical Impression Statement: OT: S: See progress note Rehab Potential: Excellent OT Plan: OT: P: Continue with plan   Goals Home Exercise Program Pt will Perform Home Exercise Program: Independently Short Term Goals Time to Complete Short Term Goals: 2 weeks Short Term Goal 1: Patient will demonstrate indepednt use of theraputty to increase grip,  pinch and coordination Short Term Goal 1 Progress: Met Short Term Goal 2: Patient will given a test of vusual perceptual skills. Short Term Goal 2 Progress: Not met Short Term Goal 3: Patient will increase LUE Coordnination with ability to throw and catch balls of various sizes, shapes and weight with bilateral and unilateral (LUE) use. Short Term Goal 3 Progress: Progressing toward goal Short Term Goal 4: Patient willdemonstrate ability to complete all fasteners independently with use of bilateral hand use. Short Term Goal 4 Progress: Progressing toward goal Short Term Goal 5: Patient will be able to hold and read newspaper. Short Term Goal 5 Progress: Progressing toward goal Long Term Goals Time to Complete Long Term Goals: 4 weeks Long Term Goal 1: Patient will demonstrate 29 second 9 hold peg test with LUE hand Long Term Goal 1 Progress: Progressing toward goal Long Term Goal 2: Patient will be able to return to full use of television including knowing how to utilize readers guide. Long Term Goal 2 Progress: Progressing toward goal Long Term Goal 3: Patient will be able to follow simple sequence for peparation of light meal. Long Term Goal 3 Progress: Progressing toward goal Long Term Goal 4: Patient will be able to sustain light physical activity over 30 minutes without a need for a break and no shortness of breath. Long Term Goal 4 Progress: Progressing toward goal Long Term Goal 5: Patient will be independent with ADL's IADL's at home with his wife  Long Term Goal 5 Progress: Progressing toward goal  Problem List Patient Active Problem List  Diagnoses  . Difficulty in walking    End of Session Activity Tolerance: Patient tolerated treatment well General Behavior During Session: [facility] for tasks performed Cognition: [facility] for tasks performed  GO No functional reporting required   Azhia Siefken L. Zanna Hawn, COTA/L  09/11/2011, 6:07 PM

## 2011-09-13 ENCOUNTER — Ambulatory Visit (HOSPITAL_COMMUNITY)
Admission: RE | Admit: 2011-09-13 | Discharge: 2011-09-13 | Disposition: A | Discharge status: Home / Self Care | Payer: Medicare Other | Source: Ambulatory Visit | Attending: Family Medicine | Admitting: Family Medicine

## 2011-09-13 ENCOUNTER — Ambulatory Visit (HOSPITAL_COMMUNITY): Payer: Self-pay | Admitting: Speech Pathology

## 2011-09-18 ENCOUNTER — Ambulatory Visit (HOSPITAL_COMMUNITY): Payer: Self-pay | Admitting: Speech Pathology

## 2011-09-18 ENCOUNTER — Ambulatory Visit (HOSPITAL_COMMUNITY)
Admission: RE | Admit: 2011-09-18 | Discharge: 2011-09-18 | Disposition: A | Discharge status: Home / Self Care | Payer: Medicare Other | Source: Ambulatory Visit | Attending: Family Medicine | Admitting: Family Medicine

## 2011-09-18 NOTE — Progress Notes (Signed)
Occupational Therapy Treatment  Patient Details  Name: Allen Mason MRN: [id number] Date of Birth: 10/16/1940  Today's Date: 09/18/2011 Time: 1022-1115 Time Calculation (min): 53 min  Visit#: 7  of 8   Re-eval: 10/09/11 Neuro re-ed  [id number]    Subjective Symptoms/Limitations Symptoms: S:  I get frustrated with this arm. Pain Assessment Currently in Pain?: No/denies  Precautions/Restrictions     Exercise/Treatments Neurological Re-education Exercises Shoulder Flexion: Strengthening;15 reps Bar Weights/Barbell (Shoulder Flexion): 3 lbs Shoulder ABduction: Strengthening;15 reps Bar Weights/Barbell (Shoulder Abduction): 3 lbs Shoulder Protraction: Strengthening;15 reps Bar Weights/Barbell (Shoulder Protraction): 3 lbs Shoulder Horizontal ABduction: Strengthening;15 reps Bar Weights/Barbell (Shoulder Horizontal Abduction): 3 lbs Shoulder External Rotation: Strengthening;15 reps Bar Weights/Barbell (Shoulder External Rotation): 3 lbs Shoulder Internal Rotation: Strengthening;15 reps Bar Weights/Barbell (Shoulder Internal Rotation): 3 lbs  Weight Bearing Exercises Weight Bearing Position: Standing Standing with weight shifting on and off: with putty.  Development of Reach  Sa[employer] Crate; Left: tossing balls into crate.  Grasp and Release Grasp and Release: Theraputty Theraputty - Flatten: blue Theraputty - Roll: blue Theraputty - Grip: blue  Theraputty - Pinch: blue Theraputty - Locate Pegs: x12  Fine Motor Coordination Fine Motor Coordination: In hand manipuation training (with key pegs.)        Occupational Therapy Assessment and Plan OT Assessment and Plan Clinical Impression Statement: OT: S:  Increase ease with catching and tossing ball. Rehab Potential: Excellent OT Plan: P:  Play catch.   Goals Home Exercise Program Pt will Perform Home Exercise Program: Independently Short Term Goals Time to Complete Short Term Goals: 2 weeks Short Term Goal 1:  Patient will demonstrate indepednt use of theraputty to increase grip, pinch and coordination Short Term Goal 2: Patient will given a test of vusual perceptual skills. Short Term Goal 3: Patient will increase LUE Coordnination with ability to throw and catch balls of various sizes, shapes and weight with bilateral and unilateral (LUE) use. Short Term Goal 4: Patient willdemonstrate ability to complete all fasteners independently with use of bilateral hand use. Short Term Goal 5: Patient will be able to hold and read newspaper. Long Term Goals Time to Complete Long Term Goals: 4 weeks Long Term Goal 1: Patient will demonstrate 29 second 9 hold peg test with LUE hand Long Term Goal 2: Patient will be able to return to full use of television including knowing how to utilize readers guide. Long Term Goal 3: Patient will be able to follow simple sequence for peparation of light meal. Long Term Goal 4: Patient will be able to sustain light physical activity over 30 minutes without a need for a break and no shortness of breath. Long Term Goal 5: Patient will be independent with ADL's IADL's at home with his wife   Problem List Patient Active Problem List  Diagnoses  . Difficulty in walking    End of Session Activity Tolerance: Patient tolerated treatment well General Behavior During Session: [facility] for tasks performed Cognition: [facility] for tasks performed  GO No functional reporting required   Emalea Mix L. Thera Flake, COTA/L  09/18/2011, 2:18 PM

## 2011-09-20 ENCOUNTER — Ambulatory Visit (HOSPITAL_COMMUNITY): Payer: Self-pay | Admitting: *Deleted

## 2011-09-20 ENCOUNTER — Ambulatory Visit (HOSPITAL_COMMUNITY)
Admission: RE | Admit: 2011-09-20 | Discharge: 2011-09-20 | Disposition: A | Discharge status: Home / Self Care | Payer: Medicare Other | Source: Ambulatory Visit | Attending: Family Medicine | Admitting: Family Medicine

## 2011-09-20 ENCOUNTER — Ambulatory Visit (HOSPITAL_COMMUNITY): Payer: Self-pay | Admitting: Speech Pathology

## 2011-09-20 NOTE — Progress Notes (Signed)
Occupational Therapy Treatment  Patient Details  Name: Allen Mason MRN: [id number] Date of Birth: 11-17-1940  Today's Date: 09/20/2011 Time: [id number] Time Calculation (min): 56 min  Visit#: 8  of 16   Re-eval: 10/09/11 Neuro-reed  [id number]    Subjective Symptoms/Limitations Symptoms: S:  I can tell it is getting better it is just out of control. Pain Assessment Currently in Pain?: No/denies  Precautions/Restrictions     Exercise/Treatments Elbow Exercises    Theraputty - Flatten: blue Theraputty - Roll: blue Theraputty - Grip: blue  Hand Gripper with Small Beads: x10  Wrist Exercises     Theraputty - Flatten: blue Theraputty - Roll: blue Theraputty - Grip: blue  Theraputty - Pinch: blue and using clothes pin to pick up beads. Theraputty - Locate Pegs: x12 Hand Gripper with Small Beads: x10  Hand Exercises Theraputty - Flatten: blue Theraputty - Roll: blue Theraputty - Grip: blue  Theraputty - Pinch: blue and using clothes pin to pick up beads. Theraputty - Locate Pegs: x12 Hand Gripper with Small Beads: x10 Fine Motor Coordination: In hand manipuation training In Hand Manipulation Training: with beeds and key pegboard.  Neurological Re-education Exercises Hand Gripper with Small Beads: x10  Weight Bearing Exercises Weight Bearing Position: Standing Standing with weight shifting on and off: with putty.  Development of Reach     Grasp and Release Grasp and Release: Theraputty Theraputty - Flatten: blue Theraputty - Roll: blue Theraputty - Grip: blue  Theraputty - Pinch: blue and using clothes pin to pick up beads. Theraputty - Locate Pegs: x12  Fine Motor Coordination Fine Motor Coordination: In hand manipuation training In Hand Manipulation Training: with beeds and key pegboard.        Occupational Therapy Assessment and Plan OT Assessment and Plan Clinical Impression Statement: S:  Picking up beads with clothes pin fatigued patients  hand and he bagan to get frustrated.  Educated on energy conservation and need for resting then resume activity. Rehab Potential: Excellent OT Plan: P:  Play catch.   Goals Home Exercise Program Pt will Perform Home Exercise Program: Independently Short Term Goals Time to Complete Short Term Goals: 2 weeks Short Term Goal 1: Patient will demonstrate indepednt use of theraputty to increase grip, pinch and coordination Short Term Goal 2: Patient will given a test of vusual perceptual skills. Short Term Goal 3: Patient will increase LUE Coordnination with ability to throw and catch balls of various sizes, shapes and weight with bilateral and unilateral (LUE) use. Short Term Goal 4: Patient willdemonstrate ability to complete all fasteners independently with use of bilateral hand use. Short Term Goal 5: Patient will be able to hold and read newspaper. Long Term Goals Time to Complete Long Term Goals: 4 weeks Long Term Goal 1: Patient will demonstrate 29 second 9 hold peg test with LUE hand Long Term Goal 2: Patient will be able to return to full use of television including knowing how to utilize readers guide. Long Term Goal 3: Patient will be able to follow simple sequence for peparation of light meal. Long Term Goal 4: Patient will be able to sustain light physical activity over 30 minutes without a need for a break and no shortness of breath. Long Term Goal 5: Patient will be independent with ADL's IADL's at home with his wife   Problem List Patient Active Problem List  Diagnoses  . Difficulty in walking    End of Session Activity Tolerance: Patient tolerated treatment well General Behavior During  Session: [facility] for tasks performed Cognition: [facility] for tasks performed  GO No functional reporting required   Leshawn Straka L. Bera Pinela, COTA/L  09/20/2011, 1:30 PM

## 2011-09-20 NOTE — Progress Notes (Signed)
Occupational Therapy Treatment  Patient Details  Name: Allen Mason MRN: [id number] Date of Birth: [birth year]  Today's Date: 09/13/2011 Re-eval: 10/09/11    Subjective Symptoms/Limitations Symptoms: S:  The doctor told me to walk 30' a day Pain Assessment Currently in Pain?: No/denies  Precautions/Restrictions     Exercise/Treatments  09/13/11 0900  Neurological Re-education Exercises  Sponges 23,  Weight Bearing Exercises  Weight Bearing Position Standing  Standing with weight shifting on and off with putty.  Grasp and Release  Grasp and Release Theraputty  Theraputty - Flatten blue  Theraputty - Roll blue  Theraputty - Grip blue   Theraputty - Pinch blue  Theraputty - Locate Pegs x12  Fine Motor Coordination  Fine Motor Coordination In hand manipuation training (key pegboard)  In Hand Manipulation Training with beeds and key pegboard.    09/13/11 0900  Shoulder Exercises: Seated  Extension Theraband;10 reps  Theraband Level (Shoulder Extension) Level 3 (Green)  Retraction Theraband;10 reps  Theraband Level (Shoulder Retraction) Level 3 (Green)  Row [employer] reps  Theraband Level (Shoulder Row) Level 3 (Green)  External Rotation Theraband;10 reps  Theraband Level (Shoulder External Rotation) Level 3 (Green)  Internal Rotation Theraband;15 reps  Theraband Level (Shoulder Internal Rotation) Level 3 (Green)  Shoulder Exercises: ROM/Strengthening  UBE (Upper Arm Bike) 3' forward 3' backwards 2.0  Wall Wash 3'  Wall Pushups 15 reps             Goals  Making good progress toward all goals  Problem List Patient Active Problem List  Diagnoses  . Difficulty in walking       GO No functional reporting required   Timeka Goette L. Thera Flake, COTA/L  09/20/2011, 3:17 PM

## 2011-09-25 ENCOUNTER — Encounter (HOSPITAL_COMMUNITY): Payer: Self-pay

## 2011-09-25 ENCOUNTER — Encounter (HOSPITAL_COMMUNITY)
Admission: RE | Admit: 2011-09-25 | Discharge: 2011-09-25 | Disposition: A | Discharge status: Home / Self Care | Payer: Medicare Other | Source: Ambulatory Visit | Attending: Family Medicine | Admitting: Family Medicine

## 2011-09-25 DIAGNOSIS — Z5189 Encounter for other specified aftercare: Secondary | ICD-10-CM | POA: Insufficient documentation

## 2011-09-25 DIAGNOSIS — E119 Type 2 diabetes mellitus without complications: Secondary | ICD-10-CM | POA: Insufficient documentation

## 2011-09-25 DIAGNOSIS — I10 Essential (primary) hypertension: Secondary | ICD-10-CM | POA: Insufficient documentation

## 2011-09-25 DIAGNOSIS — E785 Hyperlipidemia, unspecified: Secondary | ICD-10-CM | POA: Insufficient documentation

## 2011-09-25 DIAGNOSIS — Z951 Presence of aortocoronary bypass graft: Secondary | ICD-10-CM | POA: Insufficient documentation

## 2011-09-25 DIAGNOSIS — Z8673 Personal history of transient ischemic attack (TIA), and cerebral infarction without residual deficits: Secondary | ICD-10-CM | POA: Insufficient documentation

## 2011-09-25 DIAGNOSIS — I2510 Atherosclerotic heart disease of native coronary artery without angina pectoris: Secondary | ICD-10-CM | POA: Insufficient documentation

## 2011-09-25 NOTE — Progress Notes (Addendum)
Patient was referred to Cardiac Rehab by Dr. 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 During orientation advised patient on arrival and appointment times what to wear, what to do before, during and after exercise. Reviewed attendance and class policy. Talked about inclement weather and class consultation policy. Pt is scheduled to start Cardiac Rehab on 10/01/11 at 11 am.  Pt was advised to come to class 5 minutes before class starts. He was also given instructions on meeting with the dietician and attending the Family Structure classes. Pt is eager to get started.

## 2011-09-25 NOTE — Patient Instructions (Signed)
Pt has finished orientation and is scheduled to start CR on 10/01/11 at 11 am. Pt has been instructed to arrive to class 15 minutes early for scheduled class. Pt has been instructed to wear comfortable clothing and shoes with rubber soles. Pt has been told to take their medications 1 hour prior to coming to class.  If the patient is not going to attend class, he/she has been instructed to call.

## 2011-09-26 ENCOUNTER — Ambulatory Visit (HOSPITAL_COMMUNITY)
Admission: RE | Admit: 2011-09-26 | Discharge: 2011-09-26 | Disposition: A | Discharge status: Home / Self Care | Payer: Medicare Other | Source: Ambulatory Visit | Attending: Family Medicine | Admitting: Family Medicine

## 2011-09-26 DIAGNOSIS — Z951 Presence of aortocoronary bypass graft: Secondary | ICD-10-CM | POA: Insufficient documentation

## 2011-09-26 DIAGNOSIS — I69959 Hemiplegia and hemiparesis following unspecified cerebrovascular disease affecting unspecified side: Secondary | ICD-10-CM | POA: Insufficient documentation

## 2011-09-26 DIAGNOSIS — R29898 Other symptoms and signs involving the musculoskeletal system: Secondary | ICD-10-CM | POA: Insufficient documentation

## 2011-09-26 DIAGNOSIS — Z5189 Encounter for other specified aftercare: Secondary | ICD-10-CM | POA: Insufficient documentation

## 2011-09-26 DIAGNOSIS — I69919 Unspecified symptoms and signs involving cognitive functions following unspecified cerebrovascular disease: Secondary | ICD-10-CM | POA: Insufficient documentation

## 2011-09-26 DIAGNOSIS — I69998 Other sequelae following unspecified cerebrovascular disease: Secondary | ICD-10-CM | POA: Insufficient documentation

## 2011-09-26 DIAGNOSIS — R262 Difficulty in walking, not elsewhere classified: Secondary | ICD-10-CM | POA: Insufficient documentation

## 2011-09-26 NOTE — Progress Notes (Signed)
Occupational Therapy Treatment  Patient Details  Name: DONTRELL PULLAR MRN: [id number] Date of Birth: 1941-04-28  Today's Date: 09/26/2011 Time: [id number] Time Calculation (min): 58 min Neuro reed [phone]'  Visit#: 9  of 16   Re-eval: 10/09/11 Assessment Diagnosis: CVA s/p CABG,  Subjective   Precautions/Restrictions     Exercise/Treatments Supine   Seated Extension: Theraband;10 reps Theraband Level (Shoulder Extension): Level 3 (Green) Retraction: Theraband;10 reps Theraband Level (Shoulder Retraction): Level 3 (Green) Row: Theraband;10 reps Theraband Level (Shoulder Row): Level 3 (Green) Protraction: Strengthening;10 reps Protraction Weight (lbs): 2# Horizontal ABduction: Strengthening;10 reps Horizontal ABduction Weight (lbs): 2# External Rotation: Theraband;10 reps Theraband Level (Shoulder External Rotation): Level 3 (Green) External Rotation Weight (lbs): 2# Internal Rotation: Theraband;15 reps Theraband Level (Shoulder Internal Rotation): Level 3 (Green) Internal Rotation Weight (lbs): 2# Flexion: Strengthening;10 reps Flexion Weight (lbs): 2# Abduction: Strengthening;10 reps ABduction Weight (lbs): 2# ROM / Strengthening / Isometric Strengthening UBE (Upper Arm Bike): 3' forward 3' backwards 2.0 Wall Wash: 4' Wall Pushups: 15 reps Theraputty - Flatten: blue Theraputty - Roll: blue Theraputty - Grip: blue   Wrist Exercises     Theraputty - Flatten: blue Theraputty - Roll: blue Theraputty - Grip: blue  Theraputty - Pinch: blue and using clothes pin to pick up beads. Theraputty - Locate Pegs: x12  Hand Exercises Theraputty - Flatten: blue Theraputty - Roll: blue Theraputty - Grip: blue  Theraputty - Pinch: blue and using clothes pin to pick up beads. Theraputty - Locate Pegs: x12 Fine Motor Coordination: In hand manipuation training In Hand Manipulation Training: with beeds and key pegboard.  Neurological Re-education Exercises Shoulder  Flexion: Strengthening;15 reps Bar Weights/Barbell (Shoulder Flexion): 3 lbs Shoulder ABduction: Strengthening;15 reps Bar Weights/Barbell (Shoulder Abduction): 3 lbs Shoulder Horizontal ABduction: Strengthening;15 reps Bar Weights/Barbell (Shoulder Horizontal Abduction): 3 lbs Shoulder External Rotation: Strengthening;15 reps Bar Weights/Barbell (Shoulder External Rotation): 3 lbs Shoulder Internal Rotation: Strengthening;15 reps Bar Weights/Barbell (Shoulder Internal Rotation): 3 lbs  Weight Bearing Exercises Weight Bearing Position: Standing Standing with weight shifting on and off: with putty.  Development of Reach     Grasp and Release Grasp and Release: Theraputty Theraputty - Flatten: blue Theraputty - Roll: blue Theraputty - Grip: blue  Theraputty - Pinch: blue and using clothes pin to pick up beads. Theraputty - Locate Pegs: x12  Fine Motor Coordination Fine Motor Coordination: In hand manipuation training In Hand Manipulation Training: with beeds and key pegboard.        Occupational Therapy Assessment and Plan OT Assessment and Plan Clinical Impression Statement: S:  Making steady progress and increase function with hand. Rehab Potential: Excellent OT Plan: P: Play catch   Goals Home Exercise Program Pt will Perform Home Exercise Program: Independently Short Term Goals Time to Complete Short Term Goals: 2 weeks Short Term Goal 1: Patient will demonstrate indepednt use of theraputty to increase grip, pinch and coordination Short Term Goal 2: Patient will given a test of vusual perceptual skills. Short Term Goal 3: Patient will increase LUE Coordnination with ability to throw and catch balls of various sizes, shapes and weight with bilateral and unilateral (LUE) use. Short Term Goal 4: Patient willdemonstrate ability to complete all fasteners independently with use of bilateral hand use. Short Term Goal 5: Patient will be able to hold and read newspaper. Long Term  Goals Time to Complete Long Term Goals: 4 weeks Long Term Goal 1: Patient will demonstrate 29 second 9 hold peg test with LUE hand Long Term Goal  2: Patient will be able to return to full use of television including knowing how to utilize readers guide. Long Term Goal 3: Patient will be able to follow simple sequence for peparation of light meal. Long Term Goal 4: Patient will be able to sustain light physical activity over 30 minutes without a need for a break and no shortness of breath. Long Term Goal 5: Patient will be independent with ADL's IADL's at home with his wife   Problem List Patient Active Problem List  Diagnoses  . Difficulty in walking    End of Session Activity Tolerance: Patient tolerated treatment well General Behavior During Session: [facility] for tasks performed Cognition: [facility] for tasks performed  GO No functional reporting required Lyan Holck L. Keiston Manley, COTA/L  09/26/2011, 4:46 PM

## 2011-09-28 ENCOUNTER — Ambulatory Visit (HOSPITAL_COMMUNITY)
Admission: RE | Admit: 2011-09-28 | Discharge: 2011-09-28 | Disposition: A | Discharge status: Home / Self Care | Payer: Medicare Other | Source: Ambulatory Visit | Attending: Family Medicine | Admitting: Family Medicine

## 2011-09-28 ENCOUNTER — Emergency Department (HOSPITAL_COMMUNITY)
Admission: EM | Admit: 2011-09-28 | Discharge: 2011-09-28 | Disposition: A | Discharge status: Home / Self Care | Payer: Medicare Other | Attending: Emergency Medicine | Admitting: Emergency Medicine

## 2011-09-28 ENCOUNTER — Encounter (HOSPITAL_COMMUNITY): Payer: Self-pay | Admitting: Emergency Medicine

## 2011-09-28 DIAGNOSIS — E119 Type 2 diabetes mellitus without complications: Secondary | ICD-10-CM | POA: Insufficient documentation

## 2011-09-28 DIAGNOSIS — Z951 Presence of aortocoronary bypass graft: Secondary | ICD-10-CM | POA: Insufficient documentation

## 2011-09-28 DIAGNOSIS — I10 Essential (primary) hypertension: Secondary | ICD-10-CM | POA: Insufficient documentation

## 2011-09-28 DIAGNOSIS — I2510 Atherosclerotic heart disease of native coronary artery without angina pectoris: Secondary | ICD-10-CM | POA: Insufficient documentation

## 2011-09-28 DIAGNOSIS — Z87891 Personal history of nicotine dependence: Secondary | ICD-10-CM | POA: Insufficient documentation

## 2011-09-28 DIAGNOSIS — D689 Coagulation defect, unspecified: Secondary | ICD-10-CM | POA: Insufficient documentation

## 2011-09-28 DIAGNOSIS — J45909 Unspecified asthma, uncomplicated: Secondary | ICD-10-CM | POA: Insufficient documentation

## 2011-09-28 NOTE — ED Provider Notes (Cosign Needed Addendum)
History     CSN: [id number]  Arrival date & time 09/28/11  [id number]   First MD Initiated Contact with Patient 09/28/11 1102      Chief Complaint  Patient presents with  . Hypertension    (Consider location/radiation/quality/duration/timing/severity/associated sxs/prior treatment) Patient is a 71 y.o. male presenting with hypertension. The history is provided by the spouse (pt's bp is elevated). No language interpreter was used.  Hypertension This is a chronic problem. The current episode started 2 days ago. The problem occurs constantly. The problem has not changed since onset.Pertinent negatives include no chest pain, no abdominal pain and no headaches. The symptoms are aggravated by nothing. The symptoms are relieved by nothing. He has tried nothing for the symptoms. The treatment provided moderate relief.    Past Medical History  Diagnosis Date  . Asthma   . Diabetes mellitus   . Clotting disorder   . Coronary artery disease     Past Surgical History  Procedure Date  . Coronary artery bypass graft   . Abdominal aortic aneurysm repair     Family History  Problem Relation Age of Onset  . Heart disease Father     History  Substance Use Topics  . Smoking status: Former Smoker -- 0.2 packs/day for 2 years    Types: Cigarettes    Quit date: 06/25/1981  . Smokeless tobacco: Not on file  . Alcohol Use: No      Review of Systems  Constitutional: Negative for fatigue.  HENT: Negative for congestion, sinus pressure and ear discharge.   Eyes: Negative for discharge.  Respiratory: Negative for cough.   Cardiovascular: Negative for chest pain.  Gastrointestinal: Negative for abdominal pain and diarrhea.  Genitourinary: Negative for frequency and hematuria.  Musculoskeletal: Negative for back pain.  Skin: Negative for rash.  Neurological: Negative for seizures and headaches.  Hematological: Negative.   Psychiatric/Behavioral: Negative for hallucinations.    Allergies    Neosporin  Home Medications   Current Outpatient Rx  Name Route Sig Dispense Refill  . ASPIRIN 81 MG PO TABS Oral Take 81 mg by mouth daily.    . ATORVASTATIN CALCIUM 40 MG PO TABS Oral Take 40 mg by mouth daily.    Marland Kitchen LOSARTAN POTASSIUM-HCTZ 100-12.5 MG PO TABS Oral Take 1 tablet by mouth daily.    Marland Kitchen METFORMIN HCL 1000 MG PO TABS Oral Take 1,000 mg by mouth 2 (two) times daily with a meal.    . METOPROLOL TARTRATE 25 MG PO TABS Oral Take 25 mg by mouth 2 (two) times daily.      BP 148/94  Pulse 67  Temp(Src) 97.8 F (36.6 C) (Oral)  Resp 18  Ht 5\' 9"  (1.753 m)  Wt 187 lb (84.823 kg)  BMI 27.62 kg/m2  SpO2 100%  Physical Exam  Constitutional: He is oriented to person, place, and time. He appears well-developed.  HENT:  Head: Normocephalic and atraumatic.  Eyes: Conjunctivae and EOM are normal. No scleral icterus.  Neck: Neck supple. No thyromegaly present.  Cardiovascular: Normal rate and regular rhythm.  Exam reveals no gallop and no friction rub.   No murmur heard. Pulmonary/Chest: No stridor. He has no wheezes. He has no rales. He exhibits no tenderness.  Abdominal: He exhibits no distension. There is no tenderness. There is no rebound.  Musculoskeletal: Normal range of motion. He exhibits no edema.  Lymphadenopathy:    He has no cervical adenopathy.  Neurological: He is oriented to person, place, and time. Coordination normal.  Skin: No rash noted. No erythema.  Psychiatric: He has a normal mood and affect. His behavior is normal.    ED Course  Procedures (including critical care time)  Labs Reviewed - No data to display No results found.   1. HTN (hypertension)       MDM  htn poorly controled        Maudry Diego, MD 09/28/11 [place], MD 11/15/11 [phone]

## 2011-09-28 NOTE — ED Notes (Signed)
Pt was as ot this am, and staff thought that pts bp was too high and he needed to come to er for exam, wife reports that pts bp was high yesterday at home and had called there pmd and was unable to be seen.  vss at current and pt denies any complaints.  Had heart surgery in jan and stoke following that procedure.

## 2011-09-28 NOTE — ED Notes (Signed)
Wife at desk, wondering how much longer until seen, delay explained

## 2011-09-28 NOTE — Progress Notes (Signed)
Occupational Therapy Treatment  Patient Details  Name: Allen Mason MRN: [id number] Date of Birth: 1940-07-06  Today's Date: 09/28/2011  Subjective Symptoms/Limitations Symptoms: S:  My blood pressure is up.  Precautions/Restrictions      Occupational Therapy Assessment and Plan OT Assessment and Plan Clinical Impression Statement: A:  Patients BP 171/99.  Patients wife stated she took it several times this morning and administered more BP meds but has been unsuccessful getting BP dwon.  MD unable to see them today.  Escorted patient and wife to ED. OT Plan: P:  Continue as planned.    Problem List Patient Active Problem List  Diagnoses  . Difficulty in walking    Leeam Cedrone L 09/28/2011, 9:43 AM

## 2011-09-28 NOTE — ED Notes (Signed)
Pt wife states BP has been elevated for about a week. Pt denies symptoms. Recent CABG in January. Pt scheduled to see PCP next week. Was in PT today and checked BP with concern from staff. Systolic was in [id number]. NAD noted at this time.

## 2011-09-28 NOTE — Discharge Instructions (Signed)
Increase the metoprolol to 2 pills a day.  Follow up with your md as scheduled next week.

## 2011-10-01 ENCOUNTER — Encounter (HOSPITAL_COMMUNITY)
Admission: RE | Admit: 2011-10-01 | Discharge: 2011-10-01 | Disposition: A | Discharge status: Home / Self Care | Payer: Medicare Other | Source: Ambulatory Visit | Attending: *Deleted | Admitting: *Deleted

## 2011-10-03 ENCOUNTER — Encounter (HOSPITAL_COMMUNITY)
Admission: RE | Admit: 2011-10-03 | Discharge: 2011-10-03 | Disposition: A | Discharge status: Home / Self Care | Payer: Medicare Other | Source: Ambulatory Visit | Attending: *Deleted | Admitting: *Deleted

## 2011-10-05 ENCOUNTER — Encounter (HOSPITAL_COMMUNITY)
Admission: RE | Admit: 2011-10-05 | Discharge: 2011-10-05 | Disposition: A | Discharge status: Home / Self Care | Payer: Medicare Other | Source: Ambulatory Visit | Attending: *Deleted | Admitting: *Deleted

## 2011-10-08 ENCOUNTER — Ambulatory Visit (HOSPITAL_COMMUNITY)
Admission: RE | Admit: 2011-10-08 | Discharge: 2011-10-08 | Disposition: A | Discharge status: Home / Self Care | Payer: Medicare Other | Source: Ambulatory Visit | Attending: Specialist | Admitting: Specialist

## 2011-10-08 ENCOUNTER — Encounter (HOSPITAL_COMMUNITY)
Admission: RE | Admit: 2011-10-08 | Discharge: 2011-10-08 | Disposition: A | Discharge status: Home / Self Care | Payer: Medicare Other | Source: Ambulatory Visit | Attending: *Deleted | Admitting: *Deleted

## 2011-10-08 NOTE — Progress Notes (Signed)
Occupational Therapy Treatment  Patient Details  Name: Allen Mason MRN: [id number] Date of Birth: 05/06/1941  Today's Date: 10/08/2011 Time: [id number] Time Calculation (min): 55 min OT reassessment [phone] 20' Neuro re-ed 1005-1040 35' Visit#: 10  of 18   Re-eval: 11/05/11    Subjective Symptoms/Limitations Symptoms: S:  My arm shakes when I reach out to turn the radio dial in the car. Pain Assessment Currently in Pain?: No/denies Pain Score: 0-No pain  Precautions/Restrictions     Exercise/Treatments  ROM / Strengthening / Isometric Strengthening Proximal Shoulder Strengthening, Seated: 3# x 20 reps    Neurological Re-education Exercises Elbow Flexion: Strengthening;15 reps (3 #) Elbow Extension: Strengthening;15 reps (3#) Forearm Supination: Strengthening;15 reps (3#) Forearm Pronation: Strengthening;15 reps (3#) Wrist Flexion: Strengthening;15 reps (4) Wrist Extension: Strengthening;15 reps (3#)  Weight Bearing Exercises Weight Bearing Position: Standing Standing with weight shifting on and off: with putty.  Development of Reach  Development of Reach: Reaching Reaching to Shoulder Height: with 1# cuff weight completed 3 rows of wall pegboard to simulate turning radio dial in his car.  He had more difficulty with taking pegs down, arm became very fatigued by the end of the task.  Grasp and Release Grasp and Release: Theraputty Theraputty - Flatten: blue Theraputty - Roll: blue Theraputty - Grip: blue  Theraputty - Locate Pegs: x10  Fine Motor Coordination          Occupational Therapy Assessment and Plan OT Assessment and Plan Clinical Impression Statement: A:  Please refer to monthly progress note. Rehab Potential: Excellent OT Frequency: Min 2X/week OT Duration: 4 weeks OT Plan: P:  Continue 2 times a week x 4 weeks to increase FMC, reaching, [place], sustained grip.   Goals Home Exercise Program Pt will Perform Home Exercise Program:  Independently Short Term Goals Time to Complete Short Term Goals: 2 weeks Short Term Goal 1: Patient will demonstrate indepednt use of theraputty to increase grip, pinch and coordination Short Term Goal 1 Progress: Met Short Term Goal 2: Patient will given a test of vusual perceptual skills. Short Term Goal 2 Progress: Met Short Term Goal 3: Patient will increase LUE Coordnination with ability to throw and catch balls of various sizes, shapes and weight with bilateral and unilateral (LUE) use. Short Term Goal 3 Progress: Met Short Term Goal 4: Patient willdemonstrate ability to complete all fasteners independently with use of bilateral hand use. Short Term Goal 4 Progress: Met Short Term Goal 5: Patient will be able to hold and read newspaper. Short Term Goal 5 Progress: Progressing toward goal Long Term Goals Time to Complete Long Term Goals: 4 weeks Long Term Goal 1: Patient will demonstrate 29 second 9 hold peg test with LUE hand Long Term Goal 1 Progress: Progressing toward goal Long Term Goal 2: Patient will be able to return to full use of television including knowing how to utilize readers guide. Long Term Goal 2 Progress: Met Long Term Goal 3: Patient will be able to follow simple sequence for peparation of light meal. Long Term Goal 3 Progress: Progressing toward goal Long Term Goal 4: Patient will be able to sustain light physical activity over 30 minutes without a need for a break and no shortness of breath. Long Term Goal 4 Progress: Progressing toward goal Long Term Goal 5: Patient will be independent with ADL's IADL's at home with his wife  Long Term Goal 5 Progress: Progressing toward goal  Problem List Patient Active Problem List  Diagnoses  . Difficulty in  walking    End of Session Activity Tolerance: Patient tolerated treatment well General Behavior During Session: [facility] for tasks performed Cognition: [facility] for tasks performed  GO No functional reporting  required  Vangie Bicker, OTR/L  10/08/2011, 1:33 PM

## 2011-10-10 ENCOUNTER — Encounter (HOSPITAL_COMMUNITY)
Admission: RE | Admit: 2011-10-10 | Discharge: 2011-10-10 | Disposition: A | Discharge status: Home / Self Care | Payer: Medicare Other | Source: Ambulatory Visit | Attending: *Deleted | Admitting: *Deleted

## 2011-10-10 ENCOUNTER — Ambulatory Visit (HOSPITAL_COMMUNITY)
Admission: RE | Admit: 2011-10-10 | Discharge: 2011-10-10 | Disposition: A | Discharge status: Home / Self Care | Payer: Medicare Other | Source: Ambulatory Visit | Attending: Family Medicine | Admitting: Family Medicine

## 2011-10-10 NOTE — Progress Notes (Signed)
Occupational Therapy Treatment  Patient Details  Name: Allen Mason MRN: [id number] Date of Birth: 08/13/1940  Today's Date: 10/10/2011 Time: [id number] Time Calculation (min): 43 min Neuroreed 1' Visit#: 11  of 18   Re-eval: 11/05/11    Subjective Symptoms/Limitations Symptoms: S:  I can't hold a plate with this left hand.   Pain Assessment Currently in Pain?: No/denies  Precautions/Restrictions     Exercise/Treatments  ROM / Strengthening / Isometric Strengthening Proximal Shoulder Strengthening, Seated: 3# x 20 reps    Neurological Re-education Exercises Sponges: 18, 15, 21  Weight Bearing Exercises Weight Bearing Position: Standing Standing with weight shifting on and off: with putty.  Development of Reach  Development of Reach: Reaching Reaching to Waist: simulated lifting a plate into the microwave using kleenex box with .5# weight on end of kleenex box lifting from table to shoulder height with elbow extended and shoulder flexed.  Utilizing lateral pinch grasp on box.  Held box x 5" x 10 reps.  Reaching to Shoulder Height: with 1.5# cuff weight completed 3 rows of wall pegboard to simulate turning radio dial in his car.  He had more difficulty with taking pegs down, arm became very fatigued by the end of the task.  Grasp and Release Grasp and Release: Theraputty Theraputty - Flatten: blue Theraputty - Roll: blue Theraputty - Grip: blue  Theraputty - Locate Pegs: x10   Occupational Therapy Assessment and Plan OT Assessment and Plan Clinical Impression Statement: A:  Difficult to maintain lateral pinch and maintain level position of plate while lifting it.  OT Plan: P:  Increase ability to maintain level plate with reaching task..   Goals Home Exercise Program Pt will Perform Home Exercise Program: Independently Short Term Goals Time to Complete Short Term Goals: 2 weeks Short Term Goal 1: Patient will demonstrate indepednt use of theraputty to increase  grip, pinch and coordination Short Term Goal 2: Patient will given a test of vusual perceptual skills. Short Term Goal 3: Patient will increase LUE Coordnination with ability to throw and catch balls of various sizes, shapes and weight with bilateral and unilateral (LUE) use. Short Term Goal 4: Patient willdemonstrate ability to complete all fasteners independently with use of bilateral hand use. Short Term Goal 5: Patient will be able to hold and read newspaper. Long Term Goals Time to Complete Long Term Goals: 4 weeks Long Term Goal 1: Patient will demonstrate 29 second 9 hold peg test with LUE hand Long Term Goal 2: Patient will be able to return to full use of television including knowing how to utilize readers guide. Long Term Goal 3: Patient will be able to follow simple sequence for peparation of light meal. Long Term Goal 4: Patient will be able to sustain light physical activity over 30 minutes without a need for a break and no shortness of breath. Long Term Goal 5: Patient will be independent with ADL's IADL's at home with his wife   Problem List Patient Active Problem List  Diagnoses  . Difficulty in walking    End of Session Activity Tolerance: Patient tolerated treatment well General Behavior During Session: [facility] for tasks performed Cognition: [facility] for tasks performed  GO No functional reporting required  Vangie Bicker, OTR/L  10/10/2011, 10:35 AM

## 2011-10-12 ENCOUNTER — Encounter (HOSPITAL_COMMUNITY)
Admission: RE | Admit: 2011-10-12 | Discharge: 2011-10-12 | Disposition: A | Discharge status: Home / Self Care | Payer: Medicare Other | Source: Ambulatory Visit | Attending: *Deleted | Admitting: *Deleted

## 2011-10-15 ENCOUNTER — Encounter (HOSPITAL_COMMUNITY)
Admission: RE | Admit: 2011-10-15 | Discharge: 2011-10-15 | Disposition: A | Discharge status: Home / Self Care | Payer: Medicare Other | Source: Ambulatory Visit | Attending: *Deleted | Admitting: *Deleted

## 2011-10-15 ENCOUNTER — Ambulatory Visit (HOSPITAL_COMMUNITY)
Admission: RE | Admit: 2011-10-15 | Discharge: 2011-10-15 | Disposition: A | Discharge status: Home / Self Care | Payer: Medicare Other | Source: Ambulatory Visit | Attending: Family Medicine | Admitting: Family Medicine

## 2011-10-15 NOTE — Progress Notes (Signed)
Occupational Therapy Treatment  Patient Details  Name: Allen Mason MRN: [id number] Date of Birth: August 24, 1940  Today's Date: 10/15/2011 Time: [id number] Time Calculation (min): 58 min  Visit#: 12  of 18   Re-eval: 11/05/11 Neuro-reed [id number]'    Subjective Symptoms/Limitations Symptoms: S:  It took me 3 hours to fix something that used to take me 15 min. Pain Assessment Currently in Pain?: No/denies  Precautions/Restrictions     Exercise/Treatments ROM / Strengthening / Isometric Strengthening UBE (Upper Arm Bike): 3' forward 3' backwards 3.0        Neurological Re-education Weight Bearing Exercises Weight Bearing Position: Standing Standing with weight shifting on and off: with putty.  Development of Reach  Development of Reach: Reaching Reaching to Overhead Height: 2# on wrist placing pegs in large pegboard simulating reaching into cabinet.  Fatigues with 3rd row.  Grasp and Release Grasp and Release: Theraputty Theraputty - Flatten: blue Theraputty - Roll: blue Theraputty - Grip: blue   Fine Motor Coordination Fine Motor Coordination: In hand manipuation training In Hand Manipulation Training: with grooved begboard.        Occupational Therapy Assessment and Plan OT Assessment and Plan Clinical Impression Statement: A:  Pt. with increased endurance with overhead lift, had almost the entire last row down before c/o fagigue. Rehab Potential: Excellent OT Plan: P:  Increase ability to maintain level plate with reaching task.   Goals Home Exercise Program Pt will Perform Home Exercise Program: Independently Short Term Goals Time to Complete Short Term Goals: 2 weeks Short Term Goal 1: Patient will demonstrate indepednt use of theraputty to increase grip, pinch and coordination Short Term Goal 2: Patient will given a test of vusual perceptual skills. Short Term Goal 3: Patient will increase LUE Coordnination with ability to throw and catch balls of  various sizes, shapes and weight with bilateral and unilateral (LUE) use. Short Term Goal 4: Patient willdemonstrate ability to complete all fasteners independently with use of bilateral hand use. Short Term Goal 5: Patient will be able to hold and read newspaper. Long Term Goals Time to Complete Long Term Goals: 4 weeks Long Term Goal 1: Patient will demonstrate 29 second 9 hold peg test with LUE hand Long Term Goal 2: Patient will be able to return to full use of television including knowing how to utilize readers guide. Long Term Goal 3: Patient will be able to follow simple sequence for peparation of light meal. Long Term Goal 4: Patient will be able to sustain light physical activity over 30 minutes without a need for a break and no shortness of breath. Long Term Goal 5: Patient will be independent with ADL's IADL's at home with his wife   Problem List Patient Active Problem List  Diagnoses  . Difficulty in walking    End of Session Activity Tolerance: Patient tolerated treatment well General Behavior During Session: [facility] for tasks performed Cognition: [facility] for tasks performed  GO No functional reporting required   Zarya Lasseigne L. Efstathios Sawin, COTA/L  10/15/2011, 11:14 AM

## 2011-10-16 ENCOUNTER — Encounter: Payer: Self-pay | Admitting: Cardiology

## 2011-10-17 ENCOUNTER — Encounter (HOSPITAL_COMMUNITY)
Admission: RE | Admit: 2011-10-17 | Discharge: 2011-10-17 | Disposition: A | Discharge status: Home / Self Care | Payer: Medicare Other | Source: Ambulatory Visit | Attending: *Deleted | Admitting: *Deleted

## 2011-10-17 ENCOUNTER — Ambulatory Visit (HOSPITAL_COMMUNITY)
Admission: RE | Admit: 2011-10-17 | Discharge: 2011-10-17 | Disposition: A | Discharge status: Home / Self Care | Payer: Medicare Other | Source: Ambulatory Visit | Attending: Family Medicine | Admitting: Family Medicine

## 2011-10-17 NOTE — Progress Notes (Signed)
Occupational Therapy Treatment  Patient Details  Name: Allen Mason MRN: [id number] Date of Birth: [birth year]  Today's Date: 10/17/2011 Time: [id number] Time Calculation (min): 65 min  Visit#: 13  of 18   Re-eval: 11/05/11  Neuro re-ed [id number]     Subjective Symptoms/Limitations Symptoms: S:  It only took my 1 hour to work on [employer] yesterday. Pain Assessment Currently in Pain?: No/denies  Precautions/Restrictions     Exercise/Treatments Hand Exercises Theraputty - Flatten: blue Theraputty - Roll: blue Theraputty - Grip: blue  Theraputty - Locate Pegs: x12 Hand Gripper with Small Beads: x small x 10 Fine Motor Coordination:  (pop beads for fine motor strengthening and control.) In Hand Manipulation Training: opening tight bottle lid x 5  Neurological Re-education Exercises Hand Gripper with Small Beads: x small x 10  Weight Bearing Exercises Weight Bearing Position: Standing Standing with weight shifting on and off: with putty.  Development of Reach     Grasp and Release Grasp and Release: Theraputty Theraputty - Flatten: blue Theraputty - Roll: blue Theraputty - Grip: blue  Theraputty - Locate Pegs: x12  Fine Motor Coordination Fine Motor Coordination:  (pop beads for fine motor strengthening and control.) In Hand Manipulation Training: opening tight bottle lid x 5        Occupational Therapy Assessment and Plan OT Assessment and Plan Clinical Impression Statement: A:  Added more challenging fine motor and grip shrendthening. Rehab Potential: Excellent   Goals Home Exercise Program Pt will Perform Home Exercise Program: Independently Short Term Goals Time to Complete Short Term Goals: 2 weeks Short Term Goal 1: Patient will demonstrate indepednt use of theraputty to increase grip, pinch and coordination Short Term Goal 2: Patient will given a test of vusual perceptual skills. Short Term Goal 3: Patient will increase LUE Coordnination with  ability to throw and catch balls of various sizes, shapes and weight with bilateral and unilateral (LUE) use. Short Term Goal 4: Patient willdemonstrate ability to complete all fasteners independently with use of bilateral hand use. Short Term Goal 5: Patient will be able to hold and read newspaper. Long Term Goals Time to Complete Long Term Goals: 4 weeks Long Term Goal 1: Patient will demonstrate 29 second 9 hold peg test with LUE hand Long Term Goal 2: Patient will be able to return to full use of television including knowing how to utilize readers guide. Long Term Goal 3: Patient will be able to follow simple sequence for peparation of light meal. Long Term Goal 4: Patient will be able to sustain light physical activity over 30 minutes without a need for a break and no shortness of breath. Long Term Goal 5: Patient will be independent with ADL's IADL's at home with his wife   Problem List Patient Active Problem List  Diagnoses  . Difficulty in walking    End of Session Activity Tolerance: Patient tolerated treatment well General Behavior During Session: Allen Mason for tasks performed Cognition: [facility] for tasks performed  GO No functional reporting required  Allen Mason L. Trystan Eads, COTA/L  10/17/2011, 2:07 PM

## 2011-10-18 ENCOUNTER — Ambulatory Visit (INDEPENDENT_AMBULATORY_CARE_PROVIDER_SITE_OTHER): Payer: Medicare Other | Admitting: Cardiology

## 2011-10-18 ENCOUNTER — Encounter: Payer: Self-pay | Admitting: Cardiology

## 2011-10-18 VITALS — BP 132/86 | HR 56 | Resp 16 | Ht 68.0 in | Wt 195.0 lb

## 2011-10-18 DIAGNOSIS — E782 Mixed hyperlipidemia: Secondary | ICD-10-CM

## 2011-10-18 DIAGNOSIS — I429 Cardiomyopathy, unspecified: Secondary | ICD-10-CM

## 2011-10-18 DIAGNOSIS — I2510 Atherosclerotic heart disease of native coronary artery without angina pectoris: Secondary | ICD-10-CM

## 2011-10-18 DIAGNOSIS — I10 Essential (primary) hypertension: Secondary | ICD-10-CM

## 2011-10-18 NOTE — Assessment & Plan Note (Signed)
Keep an eye on blood pressure - may need further medication titration.

## 2011-10-18 NOTE — Assessment & Plan Note (Signed)
Presumably ischemic. LVEF was approximately 40% pre-CABG. Will reassess LV function with echocardiogram.

## 2011-10-18 NOTE — Patient Instructions (Signed)
**Note De-Identified Allen Mason Obfuscation** Your physician has requested that you have an echocardiogram. Echocardiography is a painless test that uses sound waves to create images of your heart. It provides your doctor with information about the size and shape of your heart and how well your heart's chambers and valves are working. This procedure takes approximately one hour. There are no restrictions for this procedure.  Your physician recommends that you continue on your current medications as directed. Please refer to the Current Medication list given to you today.  Your physician recommends that you schedule a follow-up appointment in: 6 weeks

## 2011-10-18 NOTE — Progress Notes (Signed)
Clinical Summary Allen Mason is a 71 y.o.male presenting to the office to establish cardiology followup. Primary care is with Dr. Bridget Hartshorn. I reviewed available records - he has had prior cardiology care with Dr. Cyndie Mull in [place] and more recently at [facility] where he underwent CABG in January.  He is here with his wife today. Reports medication compliance. Also participating in cardiac rehabilitation over the last three weeks. This was delayed by first having PT/OT due to his perioperative stroke.  He denies any chest pain, reports NYHA class II-III dyspnea, also weakness. He bruises easily but denies major bleeding problems. Has residual left hand clumsiness and also left leg paresthesia. States left sided weakness has improved significantly.  Recent labwork reviewed showing HgbA1C 5.9 and LDL 66 on medical therapy.   Allergies  Allergen Reactions  . Neosporin (Neomycin-Polymyx-Gramicid)     Current Outpatient Prescriptions  Medication Sig Dispense Refill  . aspirin 81 MG tablet Take 81 mg by mouth daily.      Marland Kitchen atorvastatin (LIPITOR) 40 MG tablet Take 40 mg by mouth daily.      . Calcium Carbonate-Simethicone (TUMS PLUS PO) Take by mouth as needed.      Marland Kitchen losartan-hydrochlorothiazide (HYZAAR) 50-12.5 MG per tablet Take 1 tablet by mouth daily.      . metFORMIN (GLUCOPHAGE) 1000 MG tablet Take 1,000 mg by mouth 2 (two) times daily with a meal.      . metoprolol (LOPRESSOR) 50 MG tablet Take 50 mg by mouth 2 (two) times daily.      Marland Kitchen omeprazole (PRILOSEC) 20 MG capsule Take 20 mg by mouth as needed.      . vitamin B-12 (CYANOCOBALAMIN) 1000 MCG tablet Take 1,000 mcg by mouth daily.        Past Medical History  Diagnosis Date  . Asthma   . Type 2 diabetes mellitus   . Essential hypertension, benign   . Coronary atherosclerosis of native coronary artery     Multivessel, LVEF approximately 40%  . Pulmonary nodules     Noted 2008  . Adenoma of left adrenal gland    Possible  . Mixed hyperlipidemia   . Hearing loss   . Stroke     Status post CABG with left-sided residua    Past Surgical History  Procedure Date  . Coronary artery bypass graft 07/17/11    [facility] [place] to LAD, SVG to second diagonal, SVG to first obtuse marginal  . Abdominal aortic aneurysm repair   . Hernia repair   . Appendectomy   . Arthroscopic right knee surgery     Family History  Problem Relation Age of Onset  . Heart disease Father     Social History Allen Mason reports that he quit smoking about 30 years ago. His smoking use included Cigarettes. He has a .5 pack-year smoking history. His smokeless tobacco use includes Chew. Allen Mason reports that he does not drink alcohol.  Review of Systems Occasional skipped beats, no prolonged palpitations. No falls. Good appetite and sleeping OK. Otherwise negative.  Physical Examination Filed Vitals:   10/18/11 1334  BP: 132/86  Pulse: 56  Resp: 16   Normally nourished male in NAD. HEENT: Conjunctiva and lids normal, oropharynx clear. Neck: Supple, no elevated JVP or carotid bruits, no thyromegaly. Lungs: Clear to auscultation, diminished BS, nonlabored breathing at rest. Cardiac: Regular rate and rhythm, no S3, soft systolic murmur, no pericardial rub. Thorax: Well healed sternal incision. Abdomen: Soft, nontender, bowel  sounds present, no guarding or rebound. Extremities: No pitting edema, distal pulses 1-2+. Skin: Warm and dry. Some resolving ecchymoses noted. Musculoskeletal: No kyphosis. Neuropsychiatric: Alert and oriented x3, affect grossly appropriate.   ECG Normal sinus rhythm with LAD, IVCD.    Problem List and Plan

## 2011-10-18 NOTE — Assessment & Plan Note (Signed)
Symptomatically stable at this point on medical therapy after CABG at [facility] in January. Encouraged continued cardiac rehabilitation. Followup arranged.

## 2011-10-18 NOTE — Assessment & Plan Note (Signed)
LDL well controlled on current regimen.

## 2011-10-19 ENCOUNTER — Ambulatory Visit (HOSPITAL_COMMUNITY)
Admission: RE | Admit: 2011-10-19 | Discharge: 2011-10-19 | Disposition: A | Discharge status: Home / Self Care | Payer: Medicare Other | Source: Ambulatory Visit | Attending: Cardiology | Admitting: Cardiology

## 2011-10-19 ENCOUNTER — Encounter (HOSPITAL_COMMUNITY)
Admission: RE | Admit: 2011-10-19 | Discharge: 2011-10-19 | Disposition: A | Discharge status: Home / Self Care | Payer: Medicare Other | Source: Ambulatory Visit | Attending: *Deleted | Admitting: *Deleted

## 2011-10-19 DIAGNOSIS — E785 Hyperlipidemia, unspecified: Secondary | ICD-10-CM | POA: Insufficient documentation

## 2011-10-19 DIAGNOSIS — I059 Rheumatic mitral valve disease, unspecified: Secondary | ICD-10-CM

## 2011-10-19 DIAGNOSIS — I10 Essential (primary) hypertension: Secondary | ICD-10-CM | POA: Insufficient documentation

## 2011-10-19 DIAGNOSIS — I2510 Atherosclerotic heart disease of native coronary artery without angina pectoris: Secondary | ICD-10-CM | POA: Insufficient documentation

## 2011-10-19 NOTE — Progress Notes (Signed)
*  PRELIMINARY RESULTS* Echocardiogram 2D Echocardiogram has been performed.  Tera Partridge 10/19/2011, 12:59 PM

## 2011-10-22 ENCOUNTER — Encounter (HOSPITAL_COMMUNITY)
Admission: RE | Admit: 2011-10-22 | Discharge: 2011-10-22 | Disposition: A | Discharge status: Home / Self Care | Payer: Medicare Other | Source: Ambulatory Visit | Attending: *Deleted | Admitting: *Deleted

## 2011-10-22 ENCOUNTER — Ambulatory Visit (HOSPITAL_COMMUNITY)
Admission: RE | Admit: 2011-10-22 | Discharge: 2011-10-22 | Disposition: A | Discharge status: Home / Self Care | Payer: Medicare Other | Source: Ambulatory Visit | Attending: Family Medicine | Admitting: Family Medicine

## 2011-10-22 ENCOUNTER — Telehealth: Payer: Self-pay | Admitting: Cardiology

## 2011-10-22 NOTE — Progress Notes (Signed)
Occupational Therapy Treatment  Patient Details  Name: Allen Mason MRN: [id number] Date of Birth: 20-Apr-1941  Today's Date: 10/22/2011 Time: [id number] Time Calculation (min): 47 min  Visit#: 14  of 18   Re-eval: 11/05/11 Neuro re-ed [id number]' Assessment Diagnosis: CVA s/p CABG,  Subjective Symptoms/Limitations Symptoms: S:  We had a busy weekend.  Precautions/Restrictions     Exercise/Treatments ROM / Strengthening / Isometric Strengthening UBE (Upper Arm Bike): 3' forward 3' backwards 3.5 Wall Wash: 4'   Theraputty - Flatten: blue Theraputty - Roll: blue Theraputty - Grip: blue   Wrist Exercises     Theraputty - Flatten: blue Theraputty - Roll: blue Theraputty - Grip: blue  Theraputty - Locate Pegs: x12  Hand Exercises Theraputty - Flatten: blue Theraputty - Roll: blue Theraputty - Grip: blue  Theraputty - Locate Pegs: x12 In Hand Manipulation Training: opening tight bottle lid x 5  Neurological Re-education Exercises Shoulder Flexion: Strengthening;15 reps Bar Weights/Barbell (Shoulder Flexion): 3 lbs Shoulder ABduction: Strengthening;15 reps Bar Weights/Barbell (Shoulder Abduction): 3 lbs Shoulder Protraction: Strengthening;15 reps Bar Weights/Barbell (Shoulder Protraction): 3 lbs Shoulder Horizontal ABduction: Strengthening;15 reps Bar Weights/Barbell (Shoulder Horizontal Abduction): 3 lbs Shoulder External Rotation: Strengthening;15 reps Bar Weights/Barbell (Shoulder External Rotation): 3 lbs Shoulder Internal Rotation: Strengthening;15 reps Bar Weights/Barbell (Shoulder Internal Rotation): 3 lbs  Weight Bearing Exercises Weight Bearing Position: Standing Standing with weight shifting on and off: with putty.  Development of Reach     Grasp and Release Grasp and Release: Theraputty Theraputty - Flatten: blue Theraputty - Roll: blue Theraputty - Grip: blue  Theraputty - Locate Pegs: x12  Fine Motor Coordination In Hand Manipulation  Training: opening tight bottle lid x 5        Occupational Therapy Assessment and Plan OT Assessment and Plan Clinical Impression Statement: A:  Fatigued quickly with wall wash. Pt's shoulder stiill feels weak and fatigues quickly. Rehab Potential: Excellent OT Plan: P:  Increase ability to maintain level place with reaching task.   Goals Home Exercise Program Pt will Perform Home Exercise Program: Independently Short Term Goals Time to Complete Short Term Goals: 2 weeks Short Term Goal 1: Patient will demonstrate indepednt use of theraputty to increase grip, pinch and coordination Short Term Goal 2: Patient will given a test of vusual perceptual skills. Short Term Goal 3: Patient will increase LUE Coordnination with ability to throw and catch balls of various sizes, shapes and weight with bilateral and unilateral (LUE) use. Short Term Goal 4: Patient willdemonstrate ability to complete all fasteners independently with use of bilateral hand use. Short Term Goal 5: Patient will be able to hold and read newspaper. Long Term Goals Time to Complete Long Term Goals: 4 weeks Long Term Goal 1: Patient will demonstrate 29 second 9 hold peg test with LUE hand Long Term Goal 2: Patient will be able to return to full use of television including knowing how to utilize readers guide. Long Term Goal 3: Patient will be able to follow simple sequence for peparation of light meal. Long Term Goal 4: Patient will be able to sustain light physical activity over 30 minutes without a need for a break and no shortness of breath. Long Term Goal 5: Patient will be independent with ADL's IADL's at home with his wife   Problem List Patient Active Problem List  Diagnoses  . Coronary atherosclerosis of native coronary artery  . Essential hypertension, benign  . Mixed hyperlipidemia  . Secondary cardiomyopathy    End of Session Activity Tolerance:  Patient tolerated treatment well General Behavior During  Session: [facility] for tasks performed Cognition: [facility] for tasks performed  GO No functional reporting required   Ashyia Schraeder L. Hriday Stai, COTA/L  10/22/2011, 10:46 AM

## 2011-10-22 NOTE — Telephone Encounter (Signed)
RETURNING CALL FROM [facility]

## 2011-10-23 NOTE — Telephone Encounter (Signed)
**Note De-Identified Leona Pressly Obfuscation** Pt's wife advised of pt's Echo results, she verbalized understanding./LV

## 2011-10-24 ENCOUNTER — Ambulatory Visit (HOSPITAL_COMMUNITY)
Admission: RE | Admit: 2011-10-24 | Discharge: 2011-10-24 | Disposition: A | Discharge status: Home / Self Care | Payer: Medicare Other | Source: Ambulatory Visit | Attending: Family Medicine | Admitting: Family Medicine

## 2011-10-24 ENCOUNTER — Encounter (HOSPITAL_COMMUNITY)
Admission: RE | Admit: 2011-10-24 | Discharge: 2011-10-24 | Disposition: A | Discharge status: Home / Self Care | Payer: Medicare Other | Source: Ambulatory Visit | Attending: Family Medicine | Admitting: Family Medicine

## 2011-10-24 DIAGNOSIS — Z8673 Personal history of transient ischemic attack (TIA), and cerebral infarction without residual deficits: Secondary | ICD-10-CM | POA: Insufficient documentation

## 2011-10-24 DIAGNOSIS — E119 Type 2 diabetes mellitus without complications: Secondary | ICD-10-CM | POA: Insufficient documentation

## 2011-10-24 DIAGNOSIS — I69998 Other sequelae following unspecified cerebrovascular disease: Secondary | ICD-10-CM | POA: Insufficient documentation

## 2011-10-24 DIAGNOSIS — Z5189 Encounter for other specified aftercare: Secondary | ICD-10-CM | POA: Insufficient documentation

## 2011-10-24 DIAGNOSIS — I69959 Hemiplegia and hemiparesis following unspecified cerebrovascular disease affecting unspecified side: Secondary | ICD-10-CM | POA: Insufficient documentation

## 2011-10-24 DIAGNOSIS — R29898 Other symptoms and signs involving the musculoskeletal system: Secondary | ICD-10-CM | POA: Insufficient documentation

## 2011-10-24 DIAGNOSIS — E785 Hyperlipidemia, unspecified: Secondary | ICD-10-CM | POA: Insufficient documentation

## 2011-10-24 DIAGNOSIS — I10 Essential (primary) hypertension: Secondary | ICD-10-CM | POA: Insufficient documentation

## 2011-10-24 DIAGNOSIS — Z951 Presence of aortocoronary bypass graft: Secondary | ICD-10-CM | POA: Insufficient documentation

## 2011-10-24 DIAGNOSIS — I69919 Unspecified symptoms and signs involving cognitive functions following unspecified cerebrovascular disease: Secondary | ICD-10-CM | POA: Insufficient documentation

## 2011-10-24 DIAGNOSIS — R262 Difficulty in walking, not elsewhere classified: Secondary | ICD-10-CM | POA: Insufficient documentation

## 2011-10-24 DIAGNOSIS — I2510 Atherosclerotic heart disease of native coronary artery without angina pectoris: Secondary | ICD-10-CM | POA: Insufficient documentation

## 2011-10-24 NOTE — Progress Notes (Signed)
Occupational Therapy Treatment Patient Details  Name: Allen Mason MRN: [id number] Date of Birth: December 15, 1940  Today's Date: 10/24/2011 Time: 0950-1030 OT Time Calculation (min): 40 min Neuroreed 40' Visit#: 15  of 18   Re-eval: 11/05/11    Subjective Symptoms/Limitations Symptoms: S:  My hand is still shaking when I put a plate in the microwave.  Its better, but it still shakes. Pain Assessment Currently in Pain?: No/denies Pain Score: 0-No pain  Precautions/Restrictions   N/A  Exercise/Treatments Neurological Re-education Exercises    Weight Bearing Exercises Weight Bearing Position: Standing Standing with weight shifting on and off: with putty.   Grasp and Release Grasp and Release: Theraputty Theraputty - Flatten: blue Theraputty - Roll: blue Theraputty - Grip: blue  Theraputty - Locate Pegs: 15 when pegs found, placed on lid placed at shoulder height with elbow extended to increase I with reaching and [place].     Activities of Daily Living Activities of Daily Living: Simulated reaching for radio button in truck using large pegboard on wall and 1# on wrist 30 in and 30 out.  Simulated placing plate in microwave with 2 pound weight on tissue box and lateral pinch  x 30 reps.  Holding plate with outstretched arm x 10 seconds x 3 reps.  Occupational Therapy Assessment and Plan OT Assessment and Plan Clinical Impression Statement: A:  Resumed ADL tasks to decrease speed at which his arm fatigues. OT Plan: P:  Increase weight with reaching and grip ADL activities.   Goals Home Exercise Program Pt will Perform Home Exercise Program: Independently Short Term Goals Time to Complete Short Term Goals: 2 weeks Short Term Goal 1: Patient will demonstrate indepednt use of theraputty to increase grip, pinch and coordination Short Term Goal 2: Patient will given a test of vusual perceptual skills. Short Term Goal 3: Patient will increase LUE Coordnination with ability to throw  and catch balls of various sizes, shapes and weight with bilateral and unilateral (LUE) use. Short Term Goal 4: Patient willdemonstrate ability to complete all fasteners independently with use of bilateral hand use. Short Term Goal 5: Patient will be able to hold and read newspaper. Short Term Goal 5 Progress: Progressing toward goal Long Term Goals Time to Complete Long Term Goals: 4 weeks Long Term Goal 1: Patient will demonstrate 29 second 9 hold peg test with LUE hand Long Term Goal 1 Progress: Progressing toward goal Long Term Goal 2: Patient will be able to return to full use of television including knowing how to utilize readers guide. Long Term Goal 2 Progress: Progressing toward goal Long Term Goal 3: Patient will be able to follow simple sequence for peparation of light meal. Long Term Goal 3 Progress: Progressing toward goal Long Term Goal 4: Patient will be able to sustain light physical activity over 30 minutes without a need for a break and no shortness of breath. Long Term Goal 4 Progress: Progressing toward goal Long Term Goal 5: Patient will be independent with ADL's IADL's at home with his wife  Long Term Goal 5 Progress: Progressing toward goal  Problem List Patient Active Problem List  Diagnoses  . Coronary atherosclerosis of native coronary artery  . Essential hypertension, benign  . Mixed hyperlipidemia  . Secondary cardiomyopathy    End of Session Activity Tolerance: Patient tolerated treatment well General Behavior During Session: [facility] for tasks performed Cognition: [facility] for tasks performed  GO No functional reporting required  Vangie Bicker, OTR/L  10/24/2011, 10:27 AM

## 2011-10-26 ENCOUNTER — Encounter (HOSPITAL_COMMUNITY)
Admission: RE | Admit: 2011-10-26 | Discharge: 2011-10-26 | Disposition: A | Discharge status: Home / Self Care | Payer: Medicare Other | Source: Ambulatory Visit | Attending: *Deleted | Admitting: *Deleted

## 2011-10-29 ENCOUNTER — Encounter (HOSPITAL_COMMUNITY)
Admission: RE | Admit: 2011-10-29 | Discharge: 2011-10-29 | Disposition: A | Discharge status: Home / Self Care | Payer: Medicare Other | Source: Ambulatory Visit | Attending: *Deleted | Admitting: *Deleted

## 2011-10-29 ENCOUNTER — Ambulatory Visit (HOSPITAL_COMMUNITY)
Admission: RE | Admit: 2011-10-29 | Discharge: 2011-10-29 | Disposition: A | Discharge status: Home / Self Care | Payer: Medicare Other | Source: Ambulatory Visit | Attending: Family Medicine | Admitting: Family Medicine

## 2011-10-29 NOTE — Progress Notes (Signed)
Occupational Therapy Treatment Patient Details  Name: Allen Mason MRN: [id number] Date of Birth: Jun 02, 1941  Today's Date: 10/29/2011 Time: [id number] OT Time Calculation (min): 46 min Neuro re-ed [phone] 35'  Visit#: 16  of 18   Re-eval: 11/05/11 Assessment Diagnosis: CVA s/p CABG,   Subjective Symptoms/Limitations Symptoms: S:  I drove here today, my wife is sick.  Precautions/Restrictions     Exercise/Treatments Seated Protraction: Strengthening;15 reps Protraction Weight (lbs): 2# Horizontal ABduction: Strengthening;15 reps Horizontal ABduction Weight (lbs): 2# External Rotation: Strengthening;15 reps External Rotation Weight (lbs): 2# Internal Rotation: Strengthening;15 reps Internal Rotation Weight (lbs): 2# Flexion: Strengthening;15 reps Flexion Weight (lbs): 2# Abduction: Strengthening;15 reps ABduction Weight (lbs): 2# ROM / Strengthening / Isometric Strengthening UBE (Upper Arm Bike): 3' forward 3' backwards 3.5  Wrist Exercises     Theraputty - Flatten: blue Theraputty - Roll: blue Theraputty - Grip: blue  Theraputty - Pinch: blue Theraputty - Locate Pegs: 15  Hand Exercises Theraputty - Flatten: blue Theraputty - Roll: blue Theraputty - Grip: blue  Theraputty - Pinch: blue Theraputty - Locate Pegs: 15 when pegs found, placed on lid placed at shoulder height with elbow extended to increase I with reaching and [place]. Fine Motor Coordination:  (with small beads.)  Neurological Re-education Exercises Shoulder Flexion: Strengthening;15 reps Bar Weights/Barbell (Shoulder Flexion): 3 lbs Shoulder ABduction: Strengthening;15 reps Bar Weights/Barbell (Shoulder Abduction): 3 lbs Shoulder Protraction: Strengthening;15 reps Bar Weights/Barbell (Shoulder Protraction): 3 lbs Shoulder Horizontal ABduction: Strengthening;15 reps Bar Weights/Barbell (Shoulder Horizontal Abduction): 3 lbs Shoulder External Rotation: Strengthening;15 reps Bar  Weights/Barbell (Shoulder External Rotation): 3 lbs Shoulder Internal Rotation: Strengthening;15 reps Bar Weights/Barbell (Shoulder Internal Rotation): 3 lbs  Weight Bearing Exercises Weight Bearing Position: Standing Standing with weight shifting on and off: with putty.  Development of Reach  Development of Reach: Reaching Reaching to Waist: simulated lifting a plate into microwave with 3# wt x Reaching to Overhead Height: 2# on wrist placing pegs in large pegboard simulating reaching into cabinet.  Fatigues with 3rd row.  Grasp and Release Grasp and Release: Theraputty Theraputty - Flatten: blue Theraputty - Roll: blue Theraputty - Grip: blue  Theraputty - Pinch: blue Theraputty - Locate Pegs: 15 when pegs found, placed on lid placed at shoulder height with elbow extended to increase I with reaching and [place].  Fine Motor Coordination Fine Motor Coordination:  (with small beads.)        Occupational Therapy Assessment and Plan OT Assessment and Plan Clinical Impression Statement: A:  Tolerated holding weight out for 1 min to simulate holding a plate. Rehab Potential: Excellent OT Plan: P:  Review energy conservation tech.to prevent fatigue after morning bathing and dressing.   Goals Home Exercise Program Pt will Perform Home Exercise Program: Independently Short Term Goals Time to Complete Short Term Goals: 2 weeks Short Term Goal 1: Patient will demonstrate indepednt use of theraputty to increase grip, pinch and coordination Short Term Goal 2: Patient will given a test of vusual perceptual skills. Short Term Goal 3: Patient will increase LUE Coordnination with ability to throw and catch balls of various sizes, shapes and weight with bilateral and unilateral (LUE) use. Short Term Goal 4: Patient willdemonstrate ability to complete all fasteners independently with use of bilateral hand use. Short Term Goal 5: Patient will be able to hold and read newspaper. Long Term Goals Time  to Complete Long Term Goals: 4 weeks Long Term Goal 1: Patient will demonstrate 29 second 9 hold peg test with LUE hand Long Term  Goal 2: Patient will be able to return to full use of television including knowing how to utilize readers guide. Long Term Goal 3: Patient will be able to follow simple sequence for peparation of light meal. Long Term Goal 4: Patient will be able to sustain light physical activity over 30 minutes without a need for a break and no shortness of breath. Long Term Goal 5: Patient will be independent with ADL's IADL's at home with his wife   Problem List Patient Active Problem List  Diagnoses  . Coronary atherosclerosis of native coronary artery  . Essential hypertension, benign  . Mixed hyperlipidemia  . Secondary cardiomyopathy       GO No functional reporting required   Jonika Critz L. Thera Flake, COTA/L  10/29/2011, 4:23 PM

## 2011-10-31 ENCOUNTER — Ambulatory Visit (HOSPITAL_COMMUNITY)
Admission: RE | Admit: 2011-10-31 | Discharge: 2011-10-31 | Disposition: A | Discharge status: Home / Self Care | Payer: Medicare Other | Source: Ambulatory Visit | Attending: Family Medicine | Admitting: Family Medicine

## 2011-10-31 ENCOUNTER — Encounter (HOSPITAL_COMMUNITY)
Admission: RE | Admit: 2011-10-31 | Discharge: 2011-10-31 | Disposition: A | Discharge status: Home / Self Care | Payer: Medicare Other | Source: Ambulatory Visit | Attending: *Deleted | Admitting: *Deleted

## 2011-10-31 NOTE — Progress Notes (Signed)
Occupational Therapy Treatment Patient Details  Name: Allen Mason MRN: [id number] Date of Birth: [birth year]  Today's Date: 10/31/2011 Time: 0930-1020 OT Time Calculation (min): 50 min  Visit#: 17  of 18   Re-eval: 11/05/11     Subjective Symptoms/Limitations Symptoms: I am getting more comfortable with driving. Pain Assessment Currently in Pain?: No/denies  Precautions/Restrictions     Exercise/Treatments Supine   Seated Extension: Theraband;15 reps Theraband Level (Shoulder Extension): Level 3 (Green) Retraction: Theraband;15 reps Theraband Level (Shoulder Retraction): Level 3 (Green) Row: Theraband;15 reps Theraband Level (Shoulder Row): Level 3 (Green) External Rotation: Theraband;15 reps Theraband Level (Shoulder External Rotation): Level 3 (Green) Internal Rotation: Theraband;15 reps Theraband Level (Shoulder Internal Rotation): Level 3 (Green) ROM / Strengthening / Isometric Strengthening UBE (Upper Arm Bike): 3' forward 3' backwards 4.0         Wrist Exercises Theraputty - Flatten: blue Theraputty - Roll: blue Theraputty - Grip: blue  Theraputty - Pinch: blue  Hand Exercises Theraputty - Flatten: blue Theraputty - Roll: blue Theraputty - Grip: blue  Theraputty - Pinch: blue  Neurological Re-education Weight Bearing Exercises Weight Bearing Position: Standing Standing with weight shifting on and off: with putty.  Grasp and Release Grasp and Release: Theraputty Theraputty - Flatten: blue Theraputty - Roll: blue Theraputty - Grip: blue  Theraputty - Pinch: blue   Occupational Therapy Assessment and Plan OT Assessment and Plan Clinical Impression Statement: A: Continues to fatigue with task with arm in full ext.  Patient also stated he feels his should is very weak. Rehab Potential: Excellent OT Plan: P: Check to see if patient is following energy conservation.   Goals Home Exercise Program Pt will Perform Home Exercise Program:  Independently Short Term Goals Time to Complete Short Term Goals: 2 weeks Short Term Goal 1: Patient will demonstrate indepednt use of theraputty to increase grip, pinch and coordination Short Term Goal 2: Patient will given a test of vusual perceptual skills. Short Term Goal 3: Patient will increase LUE Coordnination with ability to throw and catch balls of various sizes, shapes and weight with bilateral and unilateral (LUE) use. Short Term Goal 4: Patient willdemonstrate ability to complete all fasteners independently with use of bilateral hand use. Short Term Goal 5: Patient will be able to hold and read newspaper. Long Term Goals Time to Complete Long Term Goals: 4 weeks Long Term Goal 1: Patient will demonstrate 29 second 9 hold peg test with LUE hand Long Term Goal 2: Patient will be able to return to full use of television including knowing how to utilize readers guide. Long Term Goal 3: Patient will be able to follow simple sequence for peparation of light meal. Long Term Goal 4: Patient will be able to sustain light physical activity over 30 minutes without a need for a break and no shortness of breath. Long Term Goal 5: Patient will be independent with ADL's IADL's at home with his wife   Problem List Patient Active Problem List  Diagnoses  . Coronary atherosclerosis of native coronary artery  . Essential hypertension, benign  . Mixed hyperlipidemia  . Secondary cardiomyopathy    End of Session Activity Tolerance: Patient tolerated treatment well General Behavior During Session: [facility] for tasks performed Cognition: [facility] for tasks performed  GO No functional reporting required  Lennan Malone L. Jacorie Ernsberger, COTA/L  10/31/2011, 1:48 PM

## 2011-11-02 ENCOUNTER — Encounter (HOSPITAL_COMMUNITY)
Admission: RE | Admit: 2011-11-02 | Discharge: 2011-11-02 | Disposition: A | Discharge status: Home / Self Care | Payer: Medicare Other | Source: Ambulatory Visit | Attending: *Deleted | Admitting: *Deleted

## 2011-11-05 ENCOUNTER — Encounter (HOSPITAL_COMMUNITY)
Admission: RE | Admit: 2011-11-05 | Discharge: 2011-11-05 | Disposition: A | Discharge status: Home / Self Care | Payer: Medicare Other | Source: Ambulatory Visit | Attending: *Deleted | Admitting: *Deleted

## 2011-11-05 ENCOUNTER — Ambulatory Visit (HOSPITAL_COMMUNITY)
Admission: RE | Admit: 2011-11-05 | Discharge: 2011-11-05 | Disposition: A | Discharge status: Home / Self Care | Payer: Medicare Other | Source: Ambulatory Visit | Attending: Family Medicine | Admitting: Family Medicine

## 2011-11-05 NOTE — Progress Notes (Signed)
Occupational Therapy Treatment Patient Details  Name: Allen Mason MRN: [id number] Date of Birth: 1941-02-08  Today's Date: 11/05/2011 Time: 0930-1025 OT Time Calculation (min): 55 min  Visit#: 18  of 18   Re-eval: 11/05/11 Assessment Diagnosis: CVA s/p CABG,  Subjective Symptoms/Limitations Symptoms: S:  I couldn't get a stupid bolt started yesterday.  Precautions/Restrictions     Exercise/Treatments ROM / Strengthening / Isometric Strengthening UBE (Upper Arm Bike): 3' forward 3' backwards 4.0   Hand Exercises Theraputty - Flatten: blue Theraputty - Roll: blue Theraputty - Grip: blue  Theraputty - Pinch: blue Theraputty - Locate Pegs: x15 Fine Motor Coordination: In hand manipuation training In Hand Manipulation Training: with beads. Rubberbands: wrapped around each finger  Neurological Re-education Weight Bearing Exercises Weight Bearing Position: Standing Standing with weight shifting on and off: with putty.  Development of Reach  Reaching to Overhead Height: reaching overhead with plate and a 2# then 3# weight on plate to simulate putting in microwave.  Grasp and Release Grasp and Release: Theraputty Theraputty - Flatten: blue Theraputty - Roll: blue Theraputty - Grip: blue  Theraputty - Pinch: blue Theraputty - Locate Pegs: x15  Fine Motor Coordination Fine Motor Coordination: In hand manipuation training In Hand Manipulation Training: with beads. Rubberbands: wrapped around each finger        Occupational Therapy Assessment and Plan OT Assessment and Plan Clinical Impression Statement: A:  See progress note. Rehab Potential: Excellent OT Plan: P:  D/C to HEP.   Goals Home Exercise Program Pt will Perform Home Exercise Program: Independently Short Term Goals Time to Complete Short Term Goals: 2 weeks Short Term Goal 1: Patient will demonstrate indepednt use of theraputty to increase grip, pinch and coordination Short Term Goal 2: Patient  will given a test of vusual perceptual skills. Short Term Goal 3: Patient will increase LUE Coordnination with ability to throw and catch balls of various sizes, shapes and weight with bilateral and unilateral (LUE) use. Short Term Goal 4: Patient willdemonstrate ability to complete all fasteners independently with use of bilateral hand use. Short Term Goal 5: Patient will be able to hold and read newspaper. Long Term Goals Time to Complete Long Term Goals: 4 weeks Long Term Goal 1: Patient will demonstrate 29 second 9 hold peg test with LUE hand Long Term Goal 2: Patient will be able to return to full use of television including knowing how to utilize readers guide. Long Term Goal 3: Patient will be able to follow simple sequence for peparation of light meal. Long Term Goal 4: Patient will be able to sustain light physical activity over 30 minutes without a need for a break and no shortness of breath. Long Term Goal 5: Patient will be independent with ADL's IADL's at home with his wife   Problem List Patient Active Problem List  Diagnoses  . Coronary atherosclerosis of native coronary artery  . Essential hypertension, benign  . Mixed hyperlipidemia  . Secondary cardiomyopathy    End of Session Activity Tolerance: Patient tolerated treatment well General Behavior During Session: [facility] for tasks performed Cognition: [facility] for tasks performed  GO No functional reporting required   Amador Braddy L. Connie Hilgert, COTA/L  11/05/2011, 11:45 AM

## 2011-11-07 ENCOUNTER — Encounter (HOSPITAL_COMMUNITY)
Admission: RE | Admit: 2011-11-07 | Discharge: 2011-11-07 | Disposition: A | Discharge status: Home / Self Care | Payer: Medicare Other | Source: Ambulatory Visit | Attending: *Deleted | Admitting: *Deleted

## 2011-11-07 ENCOUNTER — Ambulatory Visit (HOSPITAL_COMMUNITY): Payer: Medicare Other | Admitting: Occupational Therapy

## 2011-11-09 ENCOUNTER — Encounter (HOSPITAL_COMMUNITY)
Admission: RE | Admit: 2011-11-09 | Discharge: 2011-11-09 | Disposition: A | Discharge status: Home / Self Care | Payer: Medicare Other | Source: Ambulatory Visit | Attending: Family Medicine | Admitting: Family Medicine

## 2011-11-12 ENCOUNTER — Ambulatory Visit (HOSPITAL_COMMUNITY): Payer: Medicare Other | Admitting: Specialist

## 2011-11-12 ENCOUNTER — Encounter (HOSPITAL_COMMUNITY)
Admission: RE | Admit: 2011-11-12 | Discharge: 2011-11-12 | Disposition: A | Discharge status: Home / Self Care | Payer: Medicare Other | Source: Ambulatory Visit | Attending: Family Medicine | Admitting: Family Medicine

## 2011-11-14 ENCOUNTER — Encounter (HOSPITAL_COMMUNITY)
Admission: RE | Admit: 2011-11-14 | Discharge: 2011-11-14 | Disposition: A | Discharge status: Home / Self Care | Payer: Medicare Other | Source: Ambulatory Visit | Attending: Family Medicine | Admitting: Family Medicine

## 2011-11-14 ENCOUNTER — Ambulatory Visit (HOSPITAL_COMMUNITY): Payer: Medicare Other | Admitting: Specialist

## 2011-11-16 ENCOUNTER — Ambulatory Visit (INDEPENDENT_AMBULATORY_CARE_PROVIDER_SITE_OTHER): Payer: Medicare Other | Admitting: Cardiology

## 2011-11-16 ENCOUNTER — Encounter: Payer: Self-pay | Admitting: Cardiology

## 2011-11-16 ENCOUNTER — Encounter (HOSPITAL_COMMUNITY)
Admission: RE | Admit: 2011-11-16 | Discharge: 2011-11-16 | Disposition: A | Discharge status: Home / Self Care | Payer: Medicare Other | Source: Ambulatory Visit | Attending: Family Medicine | Admitting: Family Medicine

## 2011-11-16 VITALS — BP 147/78 | HR 71 | Resp 16 | Ht 69.0 in | Wt 198.0 lb

## 2011-11-16 DIAGNOSIS — I10 Essential (primary) hypertension: Secondary | ICD-10-CM

## 2011-11-16 DIAGNOSIS — I2510 Atherosclerotic heart disease of native coronary artery without angina pectoris: Secondary | ICD-10-CM

## 2011-11-16 DIAGNOSIS — I429 Cardiomyopathy, unspecified: Secondary | ICD-10-CM

## 2011-11-16 MED ORDER — LOSARTAN POTASSIUM-HCTZ 100-12.5 MG PO TABS: 1.0000 | ORAL_TABLET | Freq: Every day | ORAL | Status: DC

## 2011-11-16 MED ORDER — LOSARTAN POTASSIUM-HCTZ 100-12.5 MG PO TABS: 1.0000 | ORAL_TABLET | Freq: Every day | ORAL | Status: AC

## 2011-11-16 NOTE — Patient Instructions (Addendum)
**Note De-Identified Idali Lafever Obfuscation** Your physician has recommended you make the following change in your medication: increase Hyzaar to 100/12.5 mg daily  Your physician recommends that you return for lab work in: November 30, 2011  Your physician recommends that you schedule a follow-up appointment in: 2 months

## 2011-11-16 NOTE — Progress Notes (Signed)
Clinical Summary Allen Mason is a 71 y.o.male presenting for followup. He was seen in April of this year. He is here with Allen Mason. Continues in cardiac rehabilitation. Outpatient blood pressure recordings were reviewed today, still hypertensive most of the time.  Followup echocardiogram from April demonstrated LVEF of 40-45% with scarring of the inferolateral wall, grade 1 diastolic dysfunction, trivial aortic regurgitation, moderate mitral regurgitation, mild left atrial enlargement, mild tricuspid regurgitation. We reviewed this today.  Has been having some musculoskeletal left shoulder discomfort and neuropathic symptoms. Also trouble with Allen memory. Has been stable in terms of appetite. He remains short of breath with activity.   Allergies  Allergen Reactions  . Neosporin (Neomycin-Polymyxin-Gramicidin)     Current Outpatient Prescriptions  Medication Sig Dispense Refill  . aspirin 81 MG tablet Take 81 mg by mouth daily.      Marland Kitchen atorvastatin (LIPITOR) 40 MG tablet Take 40 mg by mouth daily.      . metFORMIN (GLUCOPHAGE) 1000 MG tablet Take 1,000 mg by mouth 2 (two) times daily with a meal.      . metoprolol (LOPRESSOR) 50 MG tablet Take 50 mg by mouth 2 (two) times daily.      Marland Kitchen omeprazole (PRILOSEC) 20 MG capsule Take 20 mg by mouth as needed.      . vitamin B-12 (CYANOCOBALAMIN) 1000 MCG tablet Take 1,000 mcg by mouth daily.      Marland Kitchen losartan-hydrochlorothiazide (HYZAAR) 100-12.5 MG per tablet Take 1 tablet by mouth daily.  30 tablet  0    Past Medical History  Diagnosis Date  . Asthma   . Type 2 diabetes mellitus   . Essential hypertension, benign   . Coronary atherosclerosis of native coronary artery     Multivessel, LVEF approximately 40%  . Pulmonary nodules     Noted 2008  . Adenoma of left adrenal gland     Possible  . Mixed hyperlipidemia   . Hearing loss   . Stroke     Status post CABG with left-sided residua    Past Surgical History  Procedure Date  .  Coronary artery bypass graft 07/17/11    [facility] to LAD, SVG to second diagonal, SVG to first obtuse marginal  . Abdominal aortic aneurysm repair   . Hernia repair   . Appendectomy   . Arthroscopic right knee surgery     Social History Allen Mason reports that he quit smoking about 30 years ago. Allen smoking use included Cigarettes. He has a .5 pack-year smoking history. Allen smokeless tobacco use includes Chew. Allen Mason reports that he does not drink alcohol.  Review of Systems No palpitations or syncope. No bleeding problems. Otherwise negative except as outlined.  Physical Examination Filed Vitals:   11/16/11 1548  BP: 147/78  Pulse: 71  Resp: 16    Normally nourished male in NAD.  HEENT: Conjunctiva and lids normal, oropharynx clear.  Neck: Supple, no elevated JVP or carotid bruits, no thyromegaly.  Lungs: Clear to auscultation, diminished BS, nonlabored breathing at rest.  Cardiac: Regular rate and rhythm, no S3, soft systolic murmur, no pericardial rub.  Thorax: Well healed sternal incision.  Abdomen: Soft, nontender, bowel sounds present, no guarding or rebound.  Extremities: No pitting edema, distal pulses 1-2+.    Problem List and Plan   Coronary atherosclerosis of native coronary artery Continue medical therapy and cardiac rehabilitation. Follow up arranged.  Essential hypertension, benign Blood pressure not optimally controlled as yet. Increase Hyzaar to  100/12.5 mg daily. Followup BMET in 2 weeks.  Secondary cardiomyopathy LV function is stable at 40-45%. No indication for defibrillator.     Satira Sark, M.D., F.A.C.C.

## 2011-11-16 NOTE — Assessment & Plan Note (Signed)
Blood pressure not optimally controlled as yet. Increase Hyzaar to 100/12.5 mg daily. Followup BMET in 2 weeks.

## 2011-11-16 NOTE — Assessment & Plan Note (Signed)
Continue medical therapy and cardiac rehabilitation. Follow up arranged.

## 2011-11-16 NOTE — Assessment & Plan Note (Signed)
LV function is stable at 40-45%. No indication for defibrillator.

## 2011-11-19 ENCOUNTER — Encounter (HOSPITAL_COMMUNITY): Payer: Medicare Other

## 2011-11-20 ENCOUNTER — Ambulatory Visit (HOSPITAL_COMMUNITY): Payer: Medicare Other | Admitting: Occupational Therapy

## 2011-11-21 ENCOUNTER — Encounter (HOSPITAL_COMMUNITY)
Admission: RE | Admit: 2011-11-21 | Discharge: 2011-11-21 | Disposition: A | Discharge status: Home / Self Care | Payer: Medicare Other | Source: Ambulatory Visit | Attending: Family Medicine | Admitting: Family Medicine

## 2011-11-21 ENCOUNTER — Ambulatory Visit (HOSPITAL_COMMUNITY): Payer: Medicare Other | Admitting: Occupational Therapy

## 2011-11-23 ENCOUNTER — Ambulatory Visit (HOSPITAL_COMMUNITY): Payer: Medicare Other | Admitting: Occupational Therapy

## 2011-11-23 ENCOUNTER — Encounter (HOSPITAL_COMMUNITY): Payer: Medicare Other

## 2011-11-26 ENCOUNTER — Encounter (HOSPITAL_COMMUNITY)
Admission: RE | Admit: 2011-11-26 | Discharge: 2011-11-26 | Disposition: A | Discharge status: Home / Self Care | Payer: Medicare Other | Source: Ambulatory Visit | Attending: Cardiology | Admitting: Cardiology

## 2011-11-26 DIAGNOSIS — Z8673 Personal history of transient ischemic attack (TIA), and cerebral infarction without residual deficits: Secondary | ICD-10-CM | POA: Insufficient documentation

## 2011-11-26 DIAGNOSIS — E119 Type 2 diabetes mellitus without complications: Secondary | ICD-10-CM | POA: Insufficient documentation

## 2011-11-26 DIAGNOSIS — I10 Essential (primary) hypertension: Secondary | ICD-10-CM | POA: Insufficient documentation

## 2011-11-26 DIAGNOSIS — E785 Hyperlipidemia, unspecified: Secondary | ICD-10-CM | POA: Insufficient documentation

## 2011-11-26 DIAGNOSIS — I2510 Atherosclerotic heart disease of native coronary artery without angina pectoris: Secondary | ICD-10-CM | POA: Insufficient documentation

## 2011-11-26 DIAGNOSIS — Z5189 Encounter for other specified aftercare: Secondary | ICD-10-CM | POA: Insufficient documentation

## 2011-11-26 DIAGNOSIS — Z951 Presence of aortocoronary bypass graft: Secondary | ICD-10-CM | POA: Insufficient documentation

## 2011-11-28 ENCOUNTER — Encounter (HOSPITAL_COMMUNITY)
Admission: RE | Admit: 2011-11-28 | Discharge: 2011-11-28 | Disposition: A | Discharge status: Home / Self Care | Payer: Medicare Other | Source: Ambulatory Visit | Attending: Family Medicine | Admitting: Family Medicine

## 2011-11-29 ENCOUNTER — Ambulatory Visit: Payer: Medicare Other | Admitting: Cardiology

## 2011-11-30 ENCOUNTER — Encounter (HOSPITAL_COMMUNITY)
Admission: RE | Admit: 2011-11-30 | Discharge: 2011-11-30 | Disposition: A | Discharge status: Home / Self Care | Payer: Medicare Other | Source: Ambulatory Visit | Attending: Family Medicine | Admitting: Family Medicine

## 2011-11-30 LAB — BASIC METABOLIC PANEL
BUN: 28 mg/dL — ABNORMAL HIGH (ref 6–23)
CO2: 26 mEq/L (ref 19–32)
Calcium: 9.1 mg/dL (ref 8.4–10.5)
Creat: 1.23 mg/dL (ref 0.50–1.35)
Glucose, Bld: 143 mg/dL — ABNORMAL HIGH (ref 70–99)

## 2011-12-03 ENCOUNTER — Encounter (HOSPITAL_COMMUNITY)
Admission: RE | Admit: 2011-12-03 | Discharge: 2011-12-03 | Disposition: A | Discharge status: Home / Self Care | Payer: Medicare Other | Source: Ambulatory Visit | Attending: Family Medicine | Admitting: Family Medicine

## 2011-12-05 ENCOUNTER — Encounter (HOSPITAL_COMMUNITY)
Admission: RE | Admit: 2011-12-05 | Discharge: 2011-12-05 | Disposition: A | Discharge status: Home / Self Care | Payer: Medicare Other | Source: Ambulatory Visit | Attending: Family Medicine | Admitting: Family Medicine

## 2011-12-07 ENCOUNTER — Encounter (HOSPITAL_COMMUNITY): Payer: Medicare Other

## 2011-12-10 ENCOUNTER — Encounter (HOSPITAL_COMMUNITY)
Admission: RE | Admit: 2011-12-10 | Discharge: 2011-12-10 | Disposition: A | Discharge status: Home / Self Care | Payer: Medicare Other | Source: Ambulatory Visit | Attending: Family Medicine | Admitting: Family Medicine

## 2011-12-12 ENCOUNTER — Encounter (HOSPITAL_COMMUNITY)
Admission: RE | Admit: 2011-12-12 | Discharge: 2011-12-12 | Disposition: A | Discharge status: Home / Self Care | Payer: Medicare Other | Source: Ambulatory Visit | Attending: Family Medicine | Admitting: Family Medicine

## 2011-12-14 ENCOUNTER — Encounter (HOSPITAL_COMMUNITY): Payer: Medicare Other

## 2011-12-17 ENCOUNTER — Encounter (HOSPITAL_COMMUNITY)
Admission: RE | Admit: 2011-12-17 | Discharge: 2011-12-17 | Disposition: A | Discharge status: Home / Self Care | Payer: Medicare Other | Source: Ambulatory Visit | Attending: Family Medicine | Admitting: Family Medicine

## 2011-12-19 ENCOUNTER — Encounter (HOSPITAL_COMMUNITY)
Admission: RE | Admit: 2011-12-19 | Discharge: 2011-12-19 | Disposition: A | Discharge status: Home / Self Care | Payer: Medicare Other | Source: Ambulatory Visit | Attending: Family Medicine | Admitting: Family Medicine

## 2011-12-21 ENCOUNTER — Encounter (HOSPITAL_COMMUNITY)
Admission: RE | Admit: 2011-12-21 | Discharge: 2011-12-21 | Disposition: A | Discharge status: Home / Self Care | Payer: Medicare Other | Source: Ambulatory Visit | Attending: Family Medicine | Admitting: Family Medicine

## 2011-12-24 ENCOUNTER — Encounter (HOSPITAL_COMMUNITY)
Admission: RE | Admit: 2011-12-24 | Discharge: 2011-12-24 | Disposition: A | Discharge status: Home / Self Care | Payer: Medicare Other | Source: Ambulatory Visit | Attending: Family Medicine | Admitting: Family Medicine

## 2011-12-24 DIAGNOSIS — E119 Type 2 diabetes mellitus without complications: Secondary | ICD-10-CM | POA: Insufficient documentation

## 2011-12-24 DIAGNOSIS — I10 Essential (primary) hypertension: Secondary | ICD-10-CM | POA: Insufficient documentation

## 2011-12-24 DIAGNOSIS — I2510 Atherosclerotic heart disease of native coronary artery without angina pectoris: Secondary | ICD-10-CM | POA: Insufficient documentation

## 2011-12-24 DIAGNOSIS — Z8673 Personal history of transient ischemic attack (TIA), and cerebral infarction without residual deficits: Secondary | ICD-10-CM | POA: Insufficient documentation

## 2011-12-24 DIAGNOSIS — E785 Hyperlipidemia, unspecified: Secondary | ICD-10-CM | POA: Insufficient documentation

## 2011-12-24 DIAGNOSIS — Z5189 Encounter for other specified aftercare: Secondary | ICD-10-CM | POA: Insufficient documentation

## 2011-12-24 DIAGNOSIS — Z951 Presence of aortocoronary bypass graft: Secondary | ICD-10-CM | POA: Insufficient documentation

## 2011-12-26 ENCOUNTER — Encounter (HOSPITAL_COMMUNITY)
Admission: RE | Admit: 2011-12-26 | Discharge: 2011-12-26 | Disposition: A | Discharge status: Home / Self Care | Payer: Medicare Other | Source: Ambulatory Visit | Attending: Family Medicine | Admitting: Family Medicine

## 2011-12-26 NOTE — Progress Notes (Signed)
Cardiac Rehabilitation Program Outcomes Report   Orientation:  09/25/2011 Graduate Date:  tbd Discharge Date:  tbd # of sessions completed: 18 [id number] Artery Bypass Graft X 3  Cardiologist: Jacqulyn Ducking Family MD:  Delsa Sale Class Time:  !1:00  A.  Exercise Program:  Tolerates exercise @ 2.9 METS for 145 minutes  B.  Mental Health:  Good mental attitude  C.  Education/Instruction/Skills  Knows THR for exercise and Uses Perceived Exertion Scale and/or Dyspnea Scale  Uses Perceived Exertion Scale and/or Dyspnea Scale  D.  Nutrition/Weight Control/Body Composition:  Adherence to prescribed nutrition program: good    E.  Blood Lipids    No results found for this basename: CHOL, HDL, LDLCALC, LDLDIRECT, TRIG, CHOLHDL    F.  Lifestyle Changes:  Making positive lifestyle changes  G.  Symptoms noted with exercise:  Asymptomatic  Report Completed By:  Oletta Lamas. Daquane Aguilar RN   Comments:   This is patients halfway report. He achieved a peak METS of 2.9. His resting Hr is 61 and his resting BP is 138/78. His peak HR is 96 and his peak BP is 140/70 . A report will follow on his graduation.

## 2011-12-26 NOTE — Progress Notes (Signed)
Cardiac Rehabilitation Program Outcomes Report   Orientation:  09/25/2011 Graduate Date:  tbd Discharge Date:  tbd # of sessions completed: 3 DX: Coranary Artery Bypass Graft X 3  Cardiologist: [place]([facility] Family MD:  [facility] Class Time:  11:00  A.  Exercise Program:  Tolerates exercise @ 2.2 METS for 15 minutes  B.  Mental Health:  Good mental attitude  C.  Education/Instruction/Skills  Knows THR for exercise and Uses Perceived Exertion Scale and/or Dyspnea Scale  Uses Perceived Exertion Scale and/or Dyspnea Scale  D.  Nutrition/Weight Control/Body Composition:  Adherence to prescribed nutrition program: good    E.  Blood Lipids    No results found for this basename: CHOL, HDL, LDLCALC, LDLDIRECT, TRIG, CHOLHDL    F.  Lifestyle Changes:  Making positive lifestyle changes  G.  Symptoms noted with exercise:  Asymptomatic  Report Completed By:  Oletta Lamas. Aarica Wax RN   Comments: This is patients 1st week report. He achieved a peak METS of 2.2. His resting HR is 67 and his resting BP was 138/80 and his peak HR was 96 and his peak BP was 150/80. A report will follow on his 84 th visit. The half way mark.

## 2011-12-28 ENCOUNTER — Encounter (HOSPITAL_COMMUNITY)
Admission: RE | Admit: 2011-12-28 | Discharge: 2011-12-28 | Disposition: A | Discharge status: Home / Self Care | Payer: Medicare Other | Source: Ambulatory Visit | Attending: Family Medicine | Admitting: Family Medicine

## 2011-12-28 NOTE — Progress Notes (Signed)
Cardiac Rehabilitation Program Outcomes Report   Orientation:  09/25/2011 Graduate Date:  tbd Discharge Date:  tbd # of sessions completed: 18 DX: Coronary Artery Bypass Graft X 3  Cardiologist: Jacqulyn Ducking Family MD:  Marval Regal Class Time:  11:00  A.  Exercise Program:  Tolerates exercise @ 2.9 METS for 15 minutes  B.  Mental Health:  Good mental attitude  C.  Education/Instruction/Skills  Knows THR for exercise and Uses Perceived Exertion Scale and/or Dyspnea Scale  Uses Perceived Exertion Scale and/or Dyspnea Scale  D.  Nutrition/Weight Control/Body Composition:  Adherence to prescribed nutrition program: good    E.  Blood Lipids    No results found for this basename: CHOL, HDL, LDLCALC, LDLDIRECT, TRIG, CHOLHDL    F.  Lifestyle Changes:  Making positive lifestyle changes  G.  Symptoms noted with exercise:  Asymptomatic  Report Completed By:  Oletta Lamas. Eivin Mascio RN   Comments:  This is patients Halfway Report. He achieved a peak METS of 2.9. His resting HR is 61 and his resting BP is 138/78. His peak HR is 96 and his peak BP is 140/70. A report will follow upon his graduation.

## 2011-12-31 ENCOUNTER — Encounter (HOSPITAL_COMMUNITY)
Admission: RE | Admit: 2011-12-31 | Discharge: 2011-12-31 | Disposition: A | Discharge status: Home / Self Care | Payer: Medicare Other | Source: Ambulatory Visit | Attending: Family Medicine | Admitting: Family Medicine

## 2011-12-31 NOTE — Patient Instructions (Signed)
Discharge instruction has been reviewed in detail and patient stated an understanding of material given. Patient plans to exercise at home on his Treadmill. Cardiac Rehab staff will make f/u calls at 1 month, 6 months, and 1 year. Patient had no complaints of any abnormal S/S or pain on their exit visit. Russella Dar, Exercise Physiologist

## 2011-12-31 NOTE — Progress Notes (Addendum)
Patient graduated from [facility] today on 12/31/11 after completing 36 sessions. He achieved LTG of 30 minutes of aerobic exercise at Max Met level of 4.5. All patients vitals are WNL. Patient has met with dietician. Discharge instruction has been reviewed in detail and patient stated an understanding of material given. Patient plans to exercise at home on his Treadmill. Cardiac Rehab staff will make f/u calls at 1 month, 6 months, and 1 year. Patient had no complaints of any abnormal S/S or pain on their exit visit. Russella Dar, Exercise Physiologist

## 2012-01-02 ENCOUNTER — Encounter (HOSPITAL_COMMUNITY): Payer: Medicare Other

## 2012-01-04 ENCOUNTER — Encounter (HOSPITAL_COMMUNITY): Payer: Medicare Other

## 2012-01-04 NOTE — Progress Notes (Signed)
Cardiac Rehabilitation Program Outcomes Report   Orientation:  09/25/2011 Graduate Date:  12/31/2011 Discharge Date:  12/31/2011 # of sessions completed: 36 DX. Coronary Artery Disease/ Myocardial Infarction/ Coronary Artery Bypass Graft X3  Cardiologist: Jacqulyn Ducking Family MD:  Marval Regal Class Time:  11.00  A.  Exercise Program:  Tolerates exercise @ 4.5 METS for 15 minutes, Improved functional capacity  2.1 %, Improved  muscular strength  17 %, Improved  flexibility 1 % and Discharged to home exercise program.  Anticipated compliance:  fair  B.  Mental Health:  Good mental attitude and Quality of Life (QOL)  changes:  Overall  21.50 %, Health/Functioning 15.88 %, Socioeconomics 22.70 %, Psych/Spiritual 25.58 %, Family 30 %    C.  Education/Instruction/Skills  Accurately checks own pulse.  Rest:  52  Exercise: 100, Knows THR for exercise, Uses Perceived Exertion Scale and/or Dyspnea Scale and Attended all education classes  Uses Perceived Exertion Scale and/or Dyspnea Scale  D.  Nutrition/Weight Control/Body Composition:  Adherence to prescribed nutrition program: fair , There is no height or weight on file to calculate BMI., % Body Fat  1.4%, CBG self-monitoring and Glycemic Control:  fair    E.  Blood Lipids    No results found for this basename: CHOL, HDL, LDLCALC, LDLDIRECT, TRIG, CHOLHDL    F.  Lifestyle Changes:  Making positive lifestyle changes and Not smoking:  Quit 1983  G.  Symptoms noted with exercise:  Asymptomatic  Report Completed By:  Oletta Lamas. Tyrica Afzal RN   Comments: This is patients Graduation Report.

## 2012-01-28 NOTE — Patient Instructions (Addendum)
Your physician recommends that you schedule a follow-up appointment in: 6 months  Your physician recommends that you return for lab work in: In 6 months prior to your visit (you will receive a reminder letter)

## 2012-01-29 ENCOUNTER — Encounter: Payer: Self-pay | Admitting: Cardiology

## 2012-01-29 ENCOUNTER — Encounter: Payer: Self-pay | Admitting: *Deleted

## 2012-01-29 ENCOUNTER — Ambulatory Visit (INDEPENDENT_AMBULATORY_CARE_PROVIDER_SITE_OTHER): Payer: Medicare Other | Admitting: Cardiology

## 2012-01-29 VITALS — BP 140/86 | HR 72 | Wt 200.0 lb

## 2012-01-29 DIAGNOSIS — I10 Essential (primary) hypertension: Secondary | ICD-10-CM

## 2012-01-29 DIAGNOSIS — I429 Cardiomyopathy, unspecified: Secondary | ICD-10-CM

## 2012-01-29 DIAGNOSIS — E782 Mixed hyperlipidemia: Secondary | ICD-10-CM

## 2012-01-29 DIAGNOSIS — T24009A Burn of unspecified degree of unspecified site of unspecified lower limb, except ankle and foot, initial encounter: Secondary | ICD-10-CM

## 2012-01-29 DIAGNOSIS — I2510 Atherosclerotic heart disease of native coronary artery without angina pectoris: Secondary | ICD-10-CM

## 2012-01-29 NOTE — Assessment & Plan Note (Signed)
Continue medical therapy, sodium restriction, exercise as tolerated. Followup with primary care provider.

## 2012-01-29 NOTE — Assessment & Plan Note (Signed)
Plan to continue medical therapy and observation. Recommended exercise as tolerated. Six month visit, sooner if needed.

## 2012-01-29 NOTE — Assessment & Plan Note (Signed)
Will obtain LFT and FLP for next visit.
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
## 2012-01-29 NOTE — Assessment & Plan Note (Signed)
LV function is stable at 40-45%. No indication for defibrillator.

## 2012-01-29 NOTE — Assessment & Plan Note (Signed)
Recommended that he see his primary care provider for further attention.

## 2012-08-11 ENCOUNTER — Other Ambulatory Visit: Payer: Self-pay | Admitting: *Deleted

## 2012-08-11 ENCOUNTER — Encounter: Payer: Self-pay | Admitting: *Deleted

## 2012-08-11 DIAGNOSIS — E782 Mixed hyperlipidemia: Secondary | ICD-10-CM

## 2012-08-18 ENCOUNTER — Ambulatory Visit (INDEPENDENT_AMBULATORY_CARE_PROVIDER_SITE_OTHER): Payer: Medicare Other | Admitting: Cardiology

## 2012-08-18 ENCOUNTER — Encounter: Payer: Self-pay | Admitting: Cardiology

## 2012-08-18 VITALS — BP 124/72 | HR 61 | Ht 68.0 in | Wt 208.0 lb

## 2012-08-18 DIAGNOSIS — E782 Mixed hyperlipidemia: Secondary | ICD-10-CM

## 2012-08-18 DIAGNOSIS — I429 Cardiomyopathy, unspecified: Secondary | ICD-10-CM

## 2012-08-18 DIAGNOSIS — I2510 Atherosclerotic heart disease of native coronary artery without angina pectoris: Secondary | ICD-10-CM

## 2012-08-18 NOTE — Assessment & Plan Note (Signed)
Symptomatically stable on medical therapy status post CABG at [facility] as noted previously. He already completed cardiac rehabilitation. I spoke with him about a walking regimen, continued exercise plan. Followup arranged in 6 months.

## 2012-08-18 NOTE — Assessment & Plan Note (Signed)
LVEF 40-45% as noted by echocardiogram from last year. No active heart failure symptoms. No indication for defibrillator at this time.

## 2012-08-18 NOTE — Assessment & Plan Note (Signed)
Good blood pressure control today.

## 2012-08-18 NOTE — Progress Notes (Signed)
Clinical Summary Allen Mason is a 72 y.o.male presenting for followup. He was seen in August 2013. He is here with his wife. He reports no angina, does have dyspnea on exertion, mild to moderate. This has not changed in intensity. He reports compliance with his medications. They indicate he was having some leg discomfort on Lipitor, recently taken off of this and placed on Pravachol. He has had this followed most recently by the [facility] system and also his primary care provider.  ECG today shows sinus rhythm with left anterior fascicular block, IVCD, left atrial enlargement, possible out old inferolateral infarct pattern.  Echocardiogram from last April showed LVEF 40-45% with inferolateral akinesis and scarring, grade 1 diastolic dysfunction, moderate mitral regurgitation, mild tricuspid regurgitation.  Today we discussed his activity. He states that he has been trying to do some walking on a treadmill recently. We talked about an appropriate plan for gradually increasing his time, walking on level ground at a comfortable pace.  Allergies  Allergen Reactions  . Neosporin (Neomycin-Polymyxin-Gramicidin)     Current Outpatient Prescriptions  Medication Sig Dispense Refill  . aspirin 81 MG tablet Take 81 mg by mouth daily.      Marland Kitchen losartan-hydrochlorothiazide (HYZAAR) 100-12.5 MG per tablet Take 1 tablet by mouth daily.  90 tablet  1  . metFORMIN (GLUCOPHAGE) 1000 MG tablet Take 1,000 mg by mouth 2 (two) times daily with a meal.      . metoprolol (LOPRESSOR) 50 MG tablet Take 50 mg by mouth 2 (two) times daily.      . nitroGLYCERIN (NITROSTAT) 0.4 MG SL tablet Place 0.4 mg under the tongue every 5 (five) minutes as needed.      Marland Kitchen omeprazole (PRILOSEC) 20 MG capsule Take 20 mg by mouth as needed.      . pravastatin (PRAVACHOL) 40 MG tablet Take 40 mg by mouth daily.       No current facility-administered medications for this visit.    Past Medical History  Diagnosis Date  . Asthma   . Type  2 diabetes mellitus   . Essential hypertension, benign   . Coronary atherosclerosis of native coronary artery     Multivessel, LVEF approximately 40%  . Pulmonary nodules     Noted 2008  . Adenoma of left adrenal gland     Possible  . Mixed hyperlipidemia   . Hearing loss   . Stroke     Status post CABG with left-sided residua    Past Surgical History  Procedure Laterality Date  . Coronary artery bypass graft  07/17/11    [facility] to LAD, SVG to second diagonal, SVG to first obtuse marginal  . Abdominal aortic aneurysm repair    . Hernia repair    . Appendectomy    . Arthroscopic right knee surgery      Social History Allen Mason reports that he quit smoking about 31 years ago. His smoking use included Cigarettes. He has a .5 pack-year smoking history. His smokeless tobacco use includes Chew. Allen Mason reports that he does not drink alcohol.  Review of Systems No palpitations or syncope. No reported bleeding episodes. Stable appetite. Wife states he does have some memory lapses as before. Prior motor deficits have improved significantly. No falls. Otherwise negative.  Physical Examination Filed Vitals:   08/18/12 0839  BP: 124/72  Pulse: 61   Filed Weights   08/18/12 0839  Weight: 208 lb (94.348 kg)    NAD.  HEENT: Conjunctiva and  lids normal, oropharynx clear.  Neck: Supple, no elevated JVP or carotid bruits, no thyromegaly.  Lungs: Clear to auscultation, diminished BS, nonlabored breathing at rest.  Cardiac: Regular rate and rhythm, no S3, soft systolic murmur, no pericardial rub.  Thorax: Well healed sternal incision.  Extremities: No pitting edema, distal pulses 1-2+.    Problem List and Plan   Coronary atherosclerosis of native coronary artery Symptomatically stable on medical therapy status post CABG at [facility] as noted previously. He already completed cardiac rehabilitation. I spoke with him about a walking regimen, continued exercise plan.  Followup arranged in 6 months.  Essential hypertension, benign Good blood pressure control today.  M[facility] provider. Recently switched from Lipitor to Pravachol. Goal LDL should at least be under 100, closer to 70 if possible.  Secondary cardiomyopathy LVEF 40-45% as noted by echocardiogram from last year. No active heart failure symptoms. No indication for defibrillator at this time.    Satira Sark, M.D., F.A.C.C.

## 2012-08-18 NOTE — Patient Instructions (Addendum)
Your physician recommends that you schedule a follow-up appointment in: 6 MONTHS 

## 2012-08-18 NOTE — Assessment & Plan Note (Signed)
Followed by the [facility] system and also primary care provider. Recently switched from Lipitor to Pravachol. Goal LDL should at least be under 100, closer to 70 if possible.

## 2012-10-01 ENCOUNTER — Telehealth: Payer: Self-pay | Admitting: Cardiology

## 2012-10-01 NOTE — Telephone Encounter (Signed)
Noted. If they are able, please have him follow daily weights to see if this is increasing as well. He does have a cardiomyopathy, is already on losartan HCTZ, although we could change this to losartan alone and start on low dose Lasix if needed. Please have him keep the other followup as well to make sure that other processes or not also involved.

## 2012-10-01 NOTE — Telephone Encounter (Signed)
Pt wife noted his ankle and foot are also swollen, noted this has been going on for 4 days, pt states the edema does reduce at night most every night, pt denies SOB/chest pain pt does note his back hurts and apt was made via PCP Hiram Comber from [facility] with urologist a week ago per noted going to the bathroom every 2 hours with back pain and was advised possible kidney stones per hx of kidney and gall stones or possible prostate concern, pt noted an upcoming apt with urologist this Friday 10-03-12, please advise if any suggestions on cardiac standpoint

## 2012-10-01 NOTE — Telephone Encounter (Signed)
Patient's left legs is swollen from below the knee. / tgs

## 2012-10-02 NOTE — Telephone Encounter (Signed)
Spoke to pt wife to advise MD SM suggestion to change medication if needed, pt wife advised that pt is out in the yard working and has not complained of swelling or discomfort today, also notes pt has been out all day with no discomfort noted, pt wife advised that pt is currently weighing daily/checking blood pressure at home and recording both VS, advised pt wife to take the documentation at next f/u with cardi logy as well as the urology apt tomorrow, pt wife will inform pt about plan to change medication if the swelling persists, thus pt will call office if change is needed, however pt is not complaining of swelling or discomfort at this time

## 2012-12-03 ENCOUNTER — Telehealth: Payer: Self-pay | Admitting: *Deleted

## 2012-12-03 NOTE — Telephone Encounter (Signed)
PT NEEDS SURGICAL CLEARANCE TO HAVE KIDNEY STONES BUSTED UP BY DR Leroy Sea BOWER [phone] IN [place] WITH [facility] . PT WAS SEEN IN FEB BY DR MCDOWELL AND HAS F/U SCHEDULED FOR AUG. DOES PT NEED TO BE SEEN FOR CLEARANCE?    FIRST AVAILABLE HERE IS [facility] AND DR BOWER IS WANTING TO DO PROCEDURE ASAP.

## 2012-12-03 NOTE — Telephone Encounter (Signed)
Please advise 

## 2012-12-04 NOTE — Telephone Encounter (Signed)
If he remains clinically stable with no progressive angina symptoms on medical therapy, he should be able to proceed with lithotripsy without any further testing. He is only on aspirin based on his last note, no other antiplatelet medications or anticoagulants. If he has been stable, probably does not need an office visit before proceeding.

## 2012-12-05 NOTE — Telephone Encounter (Signed)
.  left message to have patient return my call.  

## 2012-12-08 NOTE — Telephone Encounter (Signed)
.  left message to have patient return my call.  

## 2012-12-09 NOTE — Telephone Encounter (Signed)
Spoke to pt to advise results/instructions. Pt understood. Routed this office note to Dr Marica Otter, pt denies any angina episodes

## 2013-02-19 ENCOUNTER — Ambulatory Visit: Payer: Medicare Other | Admitting: Cardiology

## 2013-03-03 ENCOUNTER — Ambulatory Visit (INDEPENDENT_AMBULATORY_CARE_PROVIDER_SITE_OTHER): Payer: Medicare Other | Admitting: Cardiology

## 2013-03-03 ENCOUNTER — Encounter: Payer: Self-pay | Admitting: Cardiology

## 2013-03-03 VITALS — BP 132/85 | HR 60 | Ht 68.0 in | Wt 199.2 lb

## 2013-03-03 DIAGNOSIS — I429 Cardiomyopathy, unspecified: Secondary | ICD-10-CM

## 2013-03-03 DIAGNOSIS — I10 Essential (primary) hypertension: Secondary | ICD-10-CM

## 2013-03-03 DIAGNOSIS — I2510 Atherosclerotic heart disease of native coronary artery without angina pectoris: Secondary | ICD-10-CM

## 2013-03-03 DIAGNOSIS — E782 Mixed hyperlipidemia: Secondary | ICD-10-CM

## 2013-03-03 NOTE — Progress Notes (Signed)
Clinical Summary Mr. Demarest is a 72 y.o.male last seen in February. He is here with his wife. They indicate that in the interim he was diagnosed with nephrolithiasis and reportedly a fairly aggressive bladder cancer. He is undergoing treatments through [facility], also has a urologist in [place]. Primary care is through [facility], lipids are followed there.  Echocardiogram from April 2013 showed LVEF 40-45% with inferolateral akinesis and scarring, grade 1 diastolic dysfunction, moderate mitral regurgitation, mild tricuspid regurgitation.  Weight is down 9 pounds from last visit. ECG today shows sinus rhythm with LAFB, PRWP. Reports no regular angina symptoms. Feels fatigued, also limited appetite stating that things sometimes "don't taste good." Has had some constipation.   Allergies  Allergen Reactions  . Neosporin [Neomycin-Polymyxin-Gramicidin]     Current Outpatient Prescriptions  Medication Sig Dispense Refill  . amLODipine (NORVASC) 10 MG tablet Take 10 mg by mouth daily.      Marland Kitchen aspirin 81 MG tablet Take 81 mg by mouth daily.      . Blood Glucose Monitoring Suppl (ONE TOUCH ULTRA 2) W/DEVICE KIT       . glimepiride (AMARYL) 1 MG tablet Take 1 mg by mouth daily before breakfast.       . losartan-hydrochlorothiazide (HYZAAR) 100-12.5 MG per tablet Take 1 tablet by mouth daily.       . metFORMIN (GLUCOPHAGE) 1000 MG tablet Take 1,000 mg by mouth 2 (two) times daily with a meal.      . metoprolol (LOPRESSOR) 50 MG tablet Take 50 mg by mouth 2 (two) times daily.      . nitroGLYCERIN (NITROSTAT) 0.4 MG SL tablet Place 0.4 mg under the tongue every 5 (five) minutes as needed.      Marland Kitchen omeprazole (PRILOSEC) 20 MG capsule Take 20 mg by mouth as needed.      . ONE TOUCH ULTRA TEST test strip       . ONETOUCH DELICA LANCETS [id number] MISC       . pravastatin (PRAVACHOL) 40 MG tablet Take 80 mg by mouth daily.       . tamsulosin (FLOMAX) 0.4 MG CAPS capsule Take 0.4 mg by mouth daily.         No current facility-administered medications for this visit.    Past Medical History  Diagnosis Date  . Asthma   . Type 2 diabetes mellitus   . Essential hypertension, benign   . Coronary atherosclerosis of native coronary artery     Multivessel, LVEF approximately 40%  . Pulmonary nodules     Noted 2008  . Adenoma of left adrenal gland     Possible  . Mixed hyperlipidemia   . Hearing loss   . Stroke     Status post CABG with left-sided residua    Past Surgical History  Procedure Laterality Date  . Coronary artery bypass graft  07/17/11    [facility] to LAD, SVG to second diagonal, SVG to first obtuse marginal  . Abdominal aortic aneurysm repair    . Hernia repair    . Appendectomy    . Arthroscopic right knee surgery      Social History Mr. Moers reports that he quit smoking about 31 years ago. His smoking use included Cigarettes. He has a .5 pack-year smoking history. His smokeless tobacco use includes Chew. Mr. Carelock reports that he does not drink alcohol.  Review of Systems No palpitations or dizziness. No reported bleeding episodes. Otherwise as outlined.  Physical Examination  Filed Vitals:   03/03/13 0811  BP: 132/85  Pulse: 60   Filed Weights   03/03/13 0811  Weight: 199 lb 4 oz (90.379 kg)    Appears comfortable at rest. HEENT: Conjunctiva and lids normal, oropharynx clear.  Neck: Supple, no elevated JVP or carotid bruits, no thyromegaly.  Thorax: Stable healed sternal incision. Lungs: Clear to auscultation, diminished BS, nonlabored breathing at rest.  Cardiac: Regular rate and rhythm, no S3, soft systolic murmur, no pericardial rub.  Extremities: No pitting edema, distal pulses 1-2+.    Problem List and Plan   Coronary atherosclerosis of native coronary artery Symptomatically stable on medical therapy. ECG reviewed. Continue observation.  Essential hypertension, benign Blood pressure is reasonable today. No changes  made.  Mixed hyperlipidemia Followed by primary care, now on Pravachol.  Secondary cardiomyopathy No progressive CHF symptoms or hospitalizations. Weight is actually down, may be multifactorial. Has only occasional ankle edema.    Satira Sark, M.D., F.A.C.C.

## 2013-03-03 NOTE — Assessment & Plan Note (Signed)
Blood pressure is reasonable today. No changes made. 

## 2013-03-03 NOTE — Assessment & Plan Note (Signed)
Followed by primary care, now on Pravachol.

## 2013-03-03 NOTE — Assessment & Plan Note (Signed)
Symptomatically stable on medical therapy. ECG reviewed. Continue observation. 

## 2013-03-03 NOTE — Assessment & Plan Note (Signed)
No progressive CHF symptoms or hospitalizations. Weight is actually down, may be multifactorial. Has only occasional ankle edema.

## 2013-03-03 NOTE — Patient Instructions (Addendum)
Your physician recommends that you schedule a follow-up appointment in: 6 MONTHS 

## 2013-09-09 ENCOUNTER — Encounter: Payer: Self-pay | Admitting: *Deleted

## 2013-10-13 ENCOUNTER — Encounter: Payer: Self-pay | Admitting: Cardiology

## 2013-10-13 ENCOUNTER — Ambulatory Visit (INDEPENDENT_AMBULATORY_CARE_PROVIDER_SITE_OTHER): Payer: Medicare HMO | Admitting: Cardiology

## 2013-10-13 VITALS — BP 121/79 | HR 87 | Ht 69.0 in | Wt 211.0 lb

## 2013-10-13 DIAGNOSIS — I429 Cardiomyopathy, unspecified: Secondary | ICD-10-CM

## 2013-10-13 DIAGNOSIS — I2510 Atherosclerotic heart disease of native coronary artery without angina pectoris: Secondary | ICD-10-CM

## 2013-10-13 DIAGNOSIS — E782 Mixed hyperlipidemia: Secondary | ICD-10-CM

## 2013-10-13 MED ORDER — METOPROLOL SUCCINATE ER 50 MG PO TB24: 50.0000 mg | ORAL_TABLET | Freq: Every day | ORAL | Status: DC

## 2013-10-13 NOTE — Assessment & Plan Note (Signed)
He continues on Pravachol. Will request most recent lipid panel for review.

## 2013-10-13 NOTE — Assessment & Plan Note (Signed)
LVEF 40-45%, no change in symptoms.

## 2013-10-13 NOTE — Assessment & Plan Note (Signed)
Symptomatically stable. Continue medical therapy and observation. Change beta blocker to Toprol-XL 50 mg once daily.

## 2013-10-13 NOTE — Patient Instructions (Addendum)
Your physician wants you to follow-up in: 6 months You will receive a reminder letter in the mail two months in advance. If you don't receive a letter, please call our office to schedule the follow-up appointment.    Your physician has recommended you make the following change in your medication:  STOP Lopressor  Start Toprol XL 50 mg daily     Thank you for choosing [facility] !

## 2013-10-13 NOTE — Progress Notes (Signed)
Clinical Summary Allen Mason is a 73 y.o.male last seen in September 2014. He is here with his wife. Describes chronic shortness of breath, no change since CABG. His wife states that he "goes too hard" some days and gets frustrated when he gets tired. He tries to stay active. He is also following with a neurologist in [place].  He has decided that he will only take his medications once a day. We discussed changing his beta blocker to long acting formulation. He reportedly had lipids done last month with Dr. Sunday Spillers in [place].  Echocardiogram from April 2013 showed LVEF 40-45% with inferolateral akinesis and scarring, grade 1 diastolic dysfunction, moderate mitral regurgitation, mild tricuspid regurgitation.   Allergies  Allergen Reactions  . Neosporin [Neomycin-Polymyxin-Gramicidin]     Current Outpatient Prescriptions  Medication Sig Dispense Refill  . alfuzosin (UROXATRAL) 10 MG 24 hr tablet Take 10 mg by mouth daily with breakfast.       . aspirin 81 MG tablet Take 81 mg by mouth daily.      . Blood Glucose Monitoring Suppl (ONE TOUCH ULTRA 2) W/DEVICE KIT       . galantamine (RAZADYNE ER) 16 MG 24 hr capsule Take 16 mg by mouth daily with breakfast.       . losartan-hydrochlorothiazide (HYZAAR) 100-12.5 MG per tablet Take 1 tablet by mouth daily.       . metFORMIN (GLUCOPHAGE) 1000 MG tablet Take 1,000 mg by mouth daily with breakfast.       . nitroGLYCERIN (NITROSTAT) 0.4 MG SL tablet Place 0.4 mg under the tongue every 5 (five) minutes as needed.      Marland Kitchen omeprazole (PRILOSEC) 20 MG capsule Take 20 mg by mouth as needed.      . ONE TOUCH ULTRA TEST test strip       . ONETOUCH DELICA LANCETS 96G MISC       . oxyCODONE (OXY IR/ROXICODONE) 5 MG immediate release tablet Take 5 mg by mouth as needed.       . pravastatin (PRAVACHOL) 40 MG tablet Take 40 mg by mouth daily.       . metoprolol succinate (TOPROL XL) 50 MG 24 hr tablet Take 1 tablet (50 mg total) by mouth daily.  Take with or immediately following a meal.  90 tablet  3   No current facility-administered medications for this visit.    Past Medical History  Diagnosis Date  . Asthma   . Type 2 diabetes mellitus   . Essential hypertension, benign   . Coronary atherosclerosis of native coronary artery     Multivessel, LVEF approximately 40%  . Pulmonary nodules     Noted 2008  . Adenoma of left adrenal gland     Possible  . Mixed hyperlipidemia   . Hearing loss   . Stroke     Status post CABG with left-sided residua    Past Surgical History  Procedure Laterality Date  . Coronary artery bypass graft  07/17/11    [facility] [place] to [place], SVG to second diagonal, SVG to first obtuse marginal  . Abdominal aortic aneurysm repair    . Hernia repair    . Appendectomy    . Arthroscopic right knee surgery      Social History Allen Mason reports that he quit smoking about 32 years ago. His smoking use included Cigarettes. He has a .5 pack-year smoking history. His smokeless tobacco use includes Chew. Allen Mason reports that he does not  drink alcohol.  Review of Systems No palpitations or syncope. No claudication. No orthopnea or PND. Otherwise as outlined.  Physical Examination Filed Vitals:   10/13/13 1101  BP: 121/79  Pulse: 87   Filed Weights   10/13/13 1101  Weight: 211 lb (95.709 kg)    Appears comfortable at rest.  HEENT: Conjunctiva and lids normal, oropharynx clear.  Neck: Supple, no elevated JVP or carotid bruits, no thyromegaly.  Thorax: Stable healed sternal incision.  Lungs: Clear to auscultation, diminished BS, nonlabored breathing at rest.  Cardiac: Regular rate and rhythm, no S3, soft systolic murmur, no pericardial rub.  Extremities: No pitting edema, distal pulses 1-2+.    Problem List and Plan   Coronary atherosclerosis of native coronary artery Symptomatically stable. Continue medical therapy and observation. Change beta blocker to Toprol-XL 50 mg once  daily.  Mixed hyperlipidemia He continues on Pravachol. Will request most recent lipid panel for review.  Secondary cardiomyopathy LVEF 40-45%, no change in symptoms.    Satira Sark, M.D., F.A.C.C.

## 2013-10-14 ENCOUNTER — Encounter: Payer: Self-pay | Admitting: Cardiology

## 2014-02-26 NOTE — Progress Notes (Signed)
HPI: [name] is a 73 year old patient of Dr. Alene Mires. We are following for ongoing assessment and management of coronary artery disease, status post CABG. Other history includes systolic dysfunction with most recent LVEF of 40-45% in April of 2013. Hypertension, with history of diabetes, and mixed hyperlipidemia. He was last seen by Dr. Domenic Polite in April 2015 and was stable. He was continued on medical therapy. His beta blocker was changed to Toprol-XL 50 mg daily.  He comes today with ongoing complaints of fatigue and DOE. His wife states that his breathing status has worsened. He has become inactive. He will not even walk to the mailbox due to fatigue. He denies chest pain,dizziness or palpitations. Has some occasional dependent edema. He states that his blood glucose is usually around 150. He is followed by new PCP, Dr.Willis.   Allergies  Allergen Reactions  . Neosporin [Neomycin-Polymyxin-Gramicidin]     Current Outpatient Prescriptions  Medication Sig Dispense Refill  . alfuzosin (UROXATRAL) 10 MG 24 hr tablet Take 10 mg by mouth daily with breakfast.       . amLODipine (NORVASC) 10 MG tablet Take 10 mg by mouth daily.      Marland Kitchen aspirin 81 MG tablet Take 81 mg by mouth daily.      Marland Kitchen atenolol (TENORMIN) 50 MG tablet Take 1 tablet (50 mg total) by mouth at bedtime.  90 tablet  3  . Blood Glucose Monitoring Suppl (ONE TOUCH ULTRA 2) W/DEVICE KIT       . finasteride (PROSCAR) 5 MG tablet Take 5 mg by mouth daily.      . Loratadine (CLARITIN) 10 MG CAPS Take by mouth.      . losartan-hydrochlorothiazide (HYZAAR) 100-12.5 MG per tablet Take 1 tablet by mouth daily.       . metFORMIN (GLUCOPHAGE) 1000 MG tablet Take 1,000 mg by mouth daily with breakfast.       . nitroGLYCERIN (NITROSTAT) 0.4 MG SL tablet Place 0.4 mg under the tongue every 5 (five) minutes as needed.      Marland Kitchen omeprazole (PRILOSEC) 20 MG capsule Take 20 mg by mouth as needed.      . ONE TOUCH ULTRA TEST test strip         . ONETOUCH DELICA LANCETS 18E MISC       . oxyCODONE (OXY IR/ROXICODONE) 5 MG immediate release tablet Take 5 mg by mouth as needed.       . pravastatin (PRAVACHOL) 40 MG tablet Take 40 mg by mouth daily.        No current facility-administered medications for this visit.    Past Medical History  Diagnosis Date  . Asthma   . Type 2 diabetes mellitus   . Essential hypertension, benign   . Coronary atherosclerosis of native coronary artery     Multivessel, LVEF approximately 40%  . Pulmonary nodules     Noted 2008  . Adenoma of left adrenal gland     Possible  . Mixed hyperlipidemia   . Hearing loss   . Stroke     Status post CABG with left-sided residua    Past Surgical History  Procedure Laterality Date  . Coronary artery bypass graft  07/17/11    [facility] [place] to [place], SVG to second diagonal, SVG to first obtuse marginal  . Abdominal aortic aneurysm repair    . Hernia repair    . Appendectomy    . Arthroscopic right knee surgery      ROS:  Review of systems complete and found to be negative unless listed above  PHYSICAL EXAM BP 122/78  Pulse 89  Ht _0  (1.753 m)  Wt 209 lb (94.802 kg)  BMI 30.85 kg/m2  SpO2 98% General: Well developed, well nourished, in no acute distress Head: Eyes PERRLA, No xanthomas.   Normal cephalic and atramatic  Lungs: Clear bilaterally to auscultation and percussion. Heart: HRRR S1 S2, without MRG.  Pulses are 2+ & equal.            No carotid bruit. No JVD.  No abdominal bruits. No femoral bruits. Abdomen: Bowel sounds are positive, abdomen soft, obese, and non-tender without masses or                  Hernia's noted. Msk:  Back normal, normal gait. Normal strength and tone for age. Extremities: No clubbing, cyanosis, mild non-pitting dependent edema.  DP +1 Neuro: Alert and oriented X 3. Psych:  Good affect, responds appropriately  ASSESSMENT AND PLAN

## 2014-03-02 ENCOUNTER — Ambulatory Visit (INDEPENDENT_AMBULATORY_CARE_PROVIDER_SITE_OTHER): Payer: Medicare HMO | Admitting: Adult Health

## 2014-03-02 ENCOUNTER — Encounter: Payer: Self-pay | Admitting: Adult Health

## 2014-03-02 VITALS — BP 122/78 | HR 89 | Ht 69.0 in | Wt 209.0 lb

## 2014-03-02 DIAGNOSIS — R0989 Other specified symptoms and signs involving the circulatory and respiratory systems: Secondary | ICD-10-CM

## 2014-03-02 DIAGNOSIS — I2510 Atherosclerotic heart disease of native coronary artery without angina pectoris: Secondary | ICD-10-CM

## 2014-03-02 DIAGNOSIS — R0609 Other forms of dyspnea: Secondary | ICD-10-CM

## 2014-03-02 DIAGNOSIS — I25810 Atherosclerosis of coronary artery bypass graft(s) without angina pectoris: Secondary | ICD-10-CM

## 2014-03-02 DIAGNOSIS — I10 Essential (primary) hypertension: Secondary | ICD-10-CM

## 2014-03-02 MED ORDER — ATENOLOL 50 MG PO TABS: 50.0000 mg | ORAL_TABLET | Freq: Every day | ORAL | Status: DC

## 2014-03-02 NOTE — Assessment & Plan Note (Signed)
Currently well controlled. No changes in medications with the exception of dosing time of atenolol to pm dose.

## 2014-03-02 NOTE — Assessment & Plan Note (Signed)
I will repeat his echo for re-evaluation of LV fx as most recent echo in 2013 demonstrated EF of 40-45%. He will also change atenolol  50 mg to HS dose instead to hopefully help daytime fatigue.  This may or may not help, but he is wiling to try it.   If decrease in EF or worsening symptoms, may consider repeating stress test, with hx of CABG in 2013, and ongoing risk factors.

## 2014-03-02 NOTE — Patient Instructions (Signed)
Your physician recommends that you schedule a follow-up appointment in: 1 month    Take your Atenolol at bedtime now      Your physician has requested that you have an echocardiogram. Echocardiography is a painless test that uses sound waves to create images of your heart. It provides your doctor with information about the size and shape of your heart and how well your heart's chambers and valves are working. This procedure takes approximately one hour. There are no restrictions for this procedure.      Thank you for choosing [facility] !

## 2014-03-02 NOTE — Progress Notes (Deleted)
Name: Allen Mason    DOB: [birth year]  Age: 73 y.o.  MR#: [id number]       PCP:  Ames Dura, MD      Insurance: Payor: [employer] MEDICARE / Plan: [employer] MEDICARE HMO/PPO / Product Type: *No Product type* /   CC:    Chief Complaint  Patient presents with  . Coronary Artery Disease  . Hypertension    VS Filed Vitals:   03/02/14 1430  BP: 122/78  Pulse: 89  Height: 5' 9"  (1.753 m)  Weight: 209 lb (94.802 kg)  SpO2: 98%    Weights Current Weight  03/02/14 209 lb (94.802 kg)  10/13/13 211 lb (95.709 kg)  03/03/13 199 lb 4 oz (90.379 kg)    Blood Pressure  BP Readings from Last 3 Encounters:  03/02/14 122/78  10/13/13 121/79  03/03/13 132/85     Admit date:  (Not on file) Last encounter with RMR:  Visit date not found   Allergy Neosporin  Current Outpatient Prescriptions  Medication Sig Dispense Refill  . alfuzosin (UROXATRAL) 10 MG 24 hr tablet Take 10 mg by mouth daily with breakfast.       . amLODipine (NORVASC) 10 MG tablet Take 10 mg by mouth daily.      Marland Kitchen aspirin 81 MG tablet Take 81 mg by mouth daily.      Marland Kitchen atenolol (TENORMIN) 50 MG tablet Take 50 mg by mouth daily.      . Blood Glucose Monitoring Suppl (ONE TOUCH ULTRA 2) W/DEVICE KIT       . finasteride (PROSCAR) 5 MG tablet Take 5 mg by mouth daily.      . Loratadine (CLARITIN) 10 MG CAPS Take by mouth.      . losartan-hydrochlorothiazide (HYZAAR) 100-12.5 MG per tablet Take 1 tablet by mouth daily.       . metFORMIN (GLUCOPHAGE) 1000 MG tablet Take 1,000 mg by mouth daily with breakfast.       . nitroGLYCERIN (NITROSTAT) 0.4 MG SL tablet Place 0.4 mg under the tongue every 5 (five) minutes as needed.      Marland Kitchen omeprazole (PRILOSEC) 20 MG capsule Take 20 mg by mouth as needed.      . ONE TOUCH ULTRA TEST test strip       . ONETOUCH DELICA LANCETS 17O MISC       . oxyCODONE (OXY IR/ROXICODONE) 5 MG immediate release tablet Take 5 mg by mouth as needed.       . pravastatin (PRAVACHOL) 40 MG tablet Take 40 mg  by mouth daily.        No current facility-administered medications for this visit.    Discontinued Meds:    Medications Discontinued During This Encounter  Medication Reason  . galantamine (RAZADYNE ER) 16 MG 24 hr capsule Error  . metoprolol succinate (TOPROL XL) 50 MG 24 hr tablet Error    Patient Active Problem List   Diagnosis Date Noted  . Coronary atherosclerosis of native coronary artery 10/18/2011  . Essential hypertension, benign 10/18/2011  . Mixed hyperlipidemia 10/18/2011  . 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    LABS    Component Value Date/Time   NA 140 11/30/2011 1240   K 4.4 11/30/2011 1240   CL 105 11/30/2011 1240   CO2 26 11/30/2011 1240   GLUCOSE 143* 11/30/2011 1240   BUN 28* 11/30/2011 1240   CREATININE 1.23 11/30/2011 1240   CALCIUM 9.1 11/30/2011 1240   CMP     Component Value Date/Time  NA 140 11/30/2011 1240   K 4.4 11/30/2011 1240   CL 105 11/30/2011 1240   CO2 26 11/30/2011 1240   GLUCOSE 143* 11/30/2011 1240   BUN 28* 11/30/2011 1240   CREATININE 1.23 11/30/2011 1240   CALCIUM 9.1 11/30/2011 1240    No results found for this basename: wbc, hgb, hct, mcv, platelets    Lipid Panel  No results found for this basename: chol, trig, hdl, cholhdl, vldl, ldlcalc    ABG No results found for this basename: phart, pco2, pco2art, po2, po2art, hco3, tco2, acidbasedef, o2sat     No results found for this basename: TSH   BNP (last 3 results) No results found for this basename: PROBNP,  in the last 8760 hours Cardiac Panel (last 3 results) No results found for this basename: CKTOTAL, CKMB, TROPONINI, RELINDX,  in the last 72 hours  Iron/TIBC/Ferritin/ %Sat No results found for this basename: iron, tibc, ferritin, ironpctsat     EKG Orders placed in visit on 03/03/13  . EKG 12-LEAD     Prior Assessment and Plan Problem List as of 03/02/2014     Cardiovascular and Mediastinum   Coronary atherosclerosis of native coronary artery   Last Assessment & Plan    10/13/2013 Office Visit Written 10/13/2013 11:21 AM by Satira Sark, MD     Symptomatically stable. Continue medical therapy and observation. Change beta blocker to Toprol-XL 50 mg once daily.    Essential hypertension, benign   Last Assessment & Plan   03/03/2013 Office Visit Written 03/03/2013  8:45 AM by Satira Sark, MD     Blood pressure is reasonable today. No changes made.    Secondary cardiomyopathy   Last Assessment & Plan   10/13/2013 Office Visit Written 10/13/2013 11:21 AM by Satira Sark, MD     LVEF 40-45%, no change in symptoms.      Other   Mixed hyperlipidemia   Last Assessment & Plan   10/13/2013 Office Visit Written 10/13/2013 11:21 AM by Satira Sark, MD     He continues on Pravachol. Will request most recent lipid panel for review.        Imaging: No results found.

## 2014-03-04 ENCOUNTER — Telehealth: Payer: Self-pay | Admitting: Adult Health

## 2014-03-04 NOTE — Telephone Encounter (Signed)
Received fax from [address] and clarified does and gave to Allen Mason to sign and will fax.

## 2014-03-04 NOTE — Telephone Encounter (Signed)
Please see refill bin / tgs  °

## 2014-03-08 ENCOUNTER — Ambulatory Visit (HOSPITAL_COMMUNITY)
Admission: RE | Admit: 2014-03-08 | Discharge: 2014-03-08 | Disposition: A | Discharge status: Home / Self Care | Payer: Medicare HMO | Source: Ambulatory Visit | Attending: Cardiology | Admitting: Cardiology

## 2014-03-08 DIAGNOSIS — I2510 Atherosclerotic heart disease of native coronary artery without angina pectoris: Secondary | ICD-10-CM | POA: Diagnosis not present

## 2014-03-08 DIAGNOSIS — E785 Hyperlipidemia, unspecified: Secondary | ICD-10-CM | POA: Insufficient documentation

## 2014-03-08 DIAGNOSIS — Z87891 Personal history of nicotine dependence: Secondary | ICD-10-CM | POA: Diagnosis not present

## 2014-03-08 DIAGNOSIS — R0989 Other specified symptoms and signs involving the circulatory and respiratory systems: Principal | ICD-10-CM | POA: Insufficient documentation

## 2014-03-08 DIAGNOSIS — I079 Rheumatic tricuspid valve disease, unspecified: Secondary | ICD-10-CM | POA: Diagnosis not present

## 2014-03-08 DIAGNOSIS — E119 Type 2 diabetes mellitus without complications: Secondary | ICD-10-CM | POA: Insufficient documentation

## 2014-03-08 DIAGNOSIS — R0609 Other forms of dyspnea: Secondary | ICD-10-CM | POA: Diagnosis not present

## 2014-03-08 DIAGNOSIS — I059 Rheumatic mitral valve disease, unspecified: Secondary | ICD-10-CM | POA: Insufficient documentation

## 2014-03-08 DIAGNOSIS — I10 Essential (primary) hypertension: Secondary | ICD-10-CM | POA: Diagnosis not present

## 2014-03-08 DIAGNOSIS — I379 Nonrheumatic pulmonary valve disorder, unspecified: Secondary | ICD-10-CM

## 2014-03-08 NOTE — Progress Notes (Signed)
  Echocardiogram 2D Echocardiogram has been performed.  [place], [place] 03/08/2014, 11:23 AM

## 2014-04-01 ENCOUNTER — Ambulatory Visit: Payer: Medicare HMO | Admitting: Cardiology

## 2014-04-02 ENCOUNTER — Encounter: Payer: Self-pay | Admitting: Cardiology

## 2014-04-02 ENCOUNTER — Ambulatory Visit (INDEPENDENT_AMBULATORY_CARE_PROVIDER_SITE_OTHER): Payer: Medicare HMO | Admitting: Cardiology

## 2014-04-02 VITALS — BP 112/72 | HR 59 | Ht 68.0 in | Wt 212.0 lb

## 2014-04-02 DIAGNOSIS — E782 Mixed hyperlipidemia: Secondary | ICD-10-CM

## 2014-04-02 DIAGNOSIS — I2510 Atherosclerotic heart disease of native coronary artery without angina pectoris: Secondary | ICD-10-CM

## 2014-04-02 DIAGNOSIS — I10 Essential (primary) hypertension: Secondary | ICD-10-CM

## 2014-04-02 DIAGNOSIS — I429 Cardiomyopathy, unspecified: Secondary | ICD-10-CM

## 2014-04-02 MED ORDER — ATENOLOL 50 MG PO TABS: 25.0000 mg | ORAL_TABLET | Freq: Every day | ORAL | Status: DC

## 2014-04-02 NOTE — Assessment & Plan Note (Signed)
Blood pressure is normal today. 

## 2014-04-02 NOTE — Assessment & Plan Note (Signed)
Improvement in LVEF to the range of 45-50% by most recent echocardiogram. Due to his fatigue and shortness of breath, we will cut atenolol back to 25 mg daily to see if this helps.

## 2014-04-02 NOTE — Assessment & Plan Note (Signed)
Continues on Pravachol, LDL 82 earlier this year.

## 2014-04-02 NOTE — Assessment & Plan Note (Signed)
Continue medical therapy and observation at this time.

## 2014-04-02 NOTE — Patient Instructions (Signed)
Your physician wants you to follow-up in: 6 months You will receive a reminder letter in the mail two months in advance. If you don't receive a letter, please call our office to schedule the follow-up appointment.     Your physician has recommended you make the following change in your medication:     DECREASE Atenolol to 25 mg in the evening    Thank you for choosing [facility] !

## 2014-04-02 NOTE — Progress Notes (Signed)
Clinical Summary Allen Mason is a 73 y.o.male last seen by Ms. Lawrence NP in September. He is here with his wife. Still feels fatigued and short of breath with activity, no angina. This did not improve by moving atenolol to the evening. We reviewed the results of his echocardiogram.  Recent followup echocardiogram in September showed improvement in LVEF the range of 27-78%, grade 1 diastolic dysfunction, wall is tender maladies consistent with known ischemic heart disease, mild mitral regurgitation, moderate left atrial enlargement, mildly reduced RV function, PASP 38 mm mercury.  Lab work from January showed total cholesterol 139, HDL 39, LDL 82, triglycerides 90.  ECG today shows sinus rhythm with IVCD, PVC, left anterior fascicular block.   Allergies  Allergen Reactions  . Neosporin [Neomycin-Polymyxin-Gramicidin]     Current Outpatient Prescriptions  Medication Sig Dispense Refill  . alfuzosin (UROXATRAL) 10 MG 24 hr tablet Take 10 mg by mouth daily with breakfast.       . amLODipine (NORVASC) 10 MG tablet Take 10 mg by mouth daily.      Marland Kitchen aspirin 81 MG tablet Take 81 mg by mouth daily.      Marland Kitchen atenolol (TENORMIN) 50 MG tablet Take 0.5 tablets (25 mg total) by mouth at bedtime.  45 tablet  3  . Blood Glucose Monitoring Suppl (ONE TOUCH ULTRA 2) W/DEVICE KIT       . finasteride (PROSCAR) 5 MG tablet Take 5 mg by mouth daily.      . Loratadine (CLARITIN) 10 MG CAPS Take by mouth.      . losartan-hydrochlorothiazide (HYZAAR) 100-12.5 MG per tablet Take 1 tablet by mouth daily.       . metFORMIN (GLUCOPHAGE) 1000 MG tablet Take 1,000 mg by mouth daily with breakfast.       . nitroGLYCERIN (NITROSTAT) 0.4 MG SL tablet Place 0.4 mg under the tongue every 5 (five) minutes as needed.      Marland Kitchen omeprazole (PRILOSEC) 20 MG capsule Take 20 mg by mouth as needed.      . ONE TOUCH ULTRA TEST test strip       . ONETOUCH DELICA LANCETS 24M MISC       . oxyCODONE (OXY IR/ROXICODONE) 5 MG  immediate release tablet Take 5 mg by mouth as needed.       . pravastatin (PRAVACHOL) 40 MG tablet Take 40 mg by mouth daily.        No current facility-administered medications for this visit.    Past Medical History  Diagnosis Date  . Asthma   . Type 2 diabetes mellitus   . Essential hypertension, benign   . Coronary atherosclerosis of native coronary artery     Multivessel, LVEF approximately 40%  . Pulmonary nodules     Noted 2008  . Adenoma of left adrenal gland     Possible  . Mixed hyperlipidemia   . Hearing loss   . Stroke     Status post CABG with left-sided residua    Past Surgical History  Procedure Laterality Date  . Coronary artery bypass graft  07/17/11    [facility] [place] to [place], SVG to second diagonal, SVG to first obtuse marginal  . Abdominal aortic aneurysm repair    . Hernia repair    . Appendectomy    . Arthroscopic right knee surgery      Social History Allen Mason reports that he quit smoking about 32 years ago. His smoking use included Cigarettes. He started smoking  about 63 years ago. He has a .5 pack-year smoking history. He has quit using smokeless tobacco. His smokeless tobacco use included Chew. Allen Mason reports that he does not drink alcohol.  Review of Systems Having nightmares. Stable appetite. No falls. Other systems reviewed and negative.  Physical Examination Filed Vitals:   04/02/14 0803  BP: 112/72  Pulse: 59   Filed Weights   04/02/14 0803  Weight: 212 lb (96.163 kg)    Appears comfortable at rest.  HEENT: Conjunctiva and lids normal, oropharynx clear.  Neck: Supple, no elevated JVP or carotid bruits, no thyromegaly.  Thorax: Stable healed sternal incision.  Lungs: Clear to auscultation, diminished BS, nonlabored breathing at rest.  Cardiac: Regular rate and rhythm, no S3, soft systolic murmur, no pericardial rub.  Extremities: No pitting edema, distal pulses 1-2+.    Problem List and Plan   Coronary  atherosclerosis of native coronary artery Continue medical therapy and observation at this time.  Secondary cardiomyopathy Improvement in LVEF to the range of 45-50% by most recent echocardiogram. Due to his fatigue and shortness of breath, we will cut atenolol back to 25 mg daily to see if this helps.  Mixed hyperlipidemia Continues on Pravachol, LDL 82 earlier this year.  Essential hypertension, benign Blood pressure is normal today.    Satira Sark, M.D., F.A.C.C.

## 2014-04-20 ENCOUNTER — Ambulatory Visit: Payer: Medicare HMO | Admitting: Cardiology

## 2014-05-06 ENCOUNTER — Telehealth: Payer: Self-pay | Admitting: Cardiology

## 2014-05-06 DIAGNOSIS — R0602 Shortness of breath: Secondary | ICD-10-CM

## 2014-05-06 DIAGNOSIS — R5382 Chronic fatigue, unspecified: Secondary | ICD-10-CM

## 2014-05-06 NOTE — Telephone Encounter (Signed)
Patient's wife wants to know if patient's heart is strong enough for cath. / tgs

## 2014-05-10 NOTE — Telephone Encounter (Signed)
Wife states since Atenolol was decreased to 25 mg on Oct 9th, he remains SOB and fatigued.States he had pulmonary testing and he does not have COPD.She states she was told by "doctors" that they only way to determine why he is SOB is to do a heart cath.That is why she called earlier wanting to know if his heart was strong enough for the procedure.   Will forward message to Dr.McDowell

## 2014-05-11 NOTE — Telephone Encounter (Signed)
Reviewed. Patient is status post CABG at [facility] back in January 2013. LVEF has improved to the range of 45-50% by the most recent echocardiogram in September. Before pursuing a heart catheterization, I would suggest a Lexiscan Cardiolite to document presence of active ischemia.

## 2014-05-11 NOTE — Telephone Encounter (Signed)
Pt agrees to lexiscan,will have Coralyn Mark call to schedule,order placed

## 2014-05-17 ENCOUNTER — Encounter (HOSPITAL_COMMUNITY)
Admission: RE | Admit: 2014-05-17 | Discharge: 2014-05-17 | Disposition: A | Discharge status: Home / Self Care | Payer: Medicare HMO | Source: Ambulatory Visit | Attending: Cardiology | Admitting: Cardiology

## 2014-05-17 ENCOUNTER — Encounter (HOSPITAL_COMMUNITY): Payer: Self-pay

## 2014-05-17 ENCOUNTER — Ambulatory Visit (HOSPITAL_COMMUNITY)
Admission: RE | Admit: 2014-05-17 | Discharge: 2014-05-17 | Disposition: A | Discharge status: Home / Self Care | Payer: Medicare HMO | Source: Ambulatory Visit | Attending: Cardiology | Admitting: Cardiology

## 2014-05-17 DIAGNOSIS — R5383 Other fatigue: Secondary | ICD-10-CM | POA: Insufficient documentation

## 2014-05-17 DIAGNOSIS — R5382 Chronic fatigue, unspecified: Secondary | ICD-10-CM | POA: Diagnosis present

## 2014-05-17 DIAGNOSIS — R0602 Shortness of breath: Secondary | ICD-10-CM | POA: Insufficient documentation

## 2014-05-17 DIAGNOSIS — I2510 Atherosclerotic heart disease of native coronary artery without angina pectoris: Secondary | ICD-10-CM | POA: Insufficient documentation

## 2014-05-17 MED ORDER — TECHNETIUM TC 99M SESTAMIBI - CARDIOLITE
30.0000 | Freq: Once | INTRAVENOUS | Status: AC | PRN
Start: 2014-05-17 — End: 2014-05-17
  Administered 2014-05-17: 30 via INTRAVENOUS

## 2014-05-17 MED ORDER — SODIUM CHLORIDE 0.9 % IJ SOLN
10.0000 mL | INTRAMUSCULAR | Status: DC | PRN
  Administered 2014-05-17: 10 mL via INTRAVENOUS
  Filled 2014-05-17: qty 10

## 2014-05-17 MED ORDER — SODIUM CHLORIDE 0.9 % IJ SOLN
INTRAMUSCULAR | Status: AC
  Administered 2014-05-17: 10 mL via INTRAVENOUS
  Filled 2014-05-17: qty 10

## 2014-05-17 MED ORDER — REGADENOSON 0.4 MG/5ML IV SOLN
INTRAVENOUS | Status: AC
  Administered 2014-05-17: 0.4 mg via INTRAVENOUS
  Filled 2014-05-17: qty 5

## 2014-05-17 MED ORDER — TECHNETIUM TC 99M SESTAMIBI GENERIC - CARDIOLITE
10.0000 | Freq: Once | INTRAVENOUS | Status: AC | PRN
  Administered 2014-05-17: 10 via INTRAVENOUS

## 2014-05-17 MED ORDER — REGADENOSON 0.4 MG/5ML IV SOLN
0.4000 mg | Freq: Once | INTRAVENOUS | Status: AC | PRN
  Administered 2014-05-17: 0.4 mg via INTRAVENOUS

## 2014-05-17 NOTE — Progress Notes (Signed)
Stress Lab Nurses Notes - Allen Mason  Allen Mason 05/17/2014 Reason for doing test: CAD, fatigue & SOB Type of test: Wille Glaser Nurse performing test: Gerrit Halls, RN Nuclear Medicine Tech: Melburn Hake Echo Tech: Not Applicable MD performing test: Branch/K.Purcell Nails NP Family MD: Sunday Spillers Test explained and consent signed: Yes.   IV started: Saline lock flushed, No redness or edema and Saline lock started in radiology Symptoms: None Treatment/Intervention: None Reason test stopped: protocol completed After recovery IV was: Discontinued via X-ray tech and No redness or edema Patient to return to Nuc. Med at : 12:30 Patient discharged: Home Patient's Condition upon discharge was: stable Comments: During test BP 141/75 & HR 65.  Recovery BP 135/77 & HR 61.  Symptoms resolved in recovery.  [name] T

## 2014-05-31 ENCOUNTER — Encounter: Payer: Self-pay | Admitting: *Deleted

## 2014-05-31 ENCOUNTER — Ambulatory Visit (INDEPENDENT_AMBULATORY_CARE_PROVIDER_SITE_OTHER): Payer: Medicare HMO | Admitting: Cardiology

## 2014-05-31 ENCOUNTER — Encounter: Payer: Self-pay | Admitting: Cardiology

## 2014-05-31 VITALS — BP 120/72 | HR 40 | Ht 69.0 in | Wt 213.6 lb

## 2014-05-31 DIAGNOSIS — I10 Essential (primary) hypertension: Secondary | ICD-10-CM

## 2014-05-31 DIAGNOSIS — I2510 Atherosclerotic heart disease of native coronary artery without angina pectoris: Secondary | ICD-10-CM

## 2014-05-31 DIAGNOSIS — I429 Cardiomyopathy, unspecified: Secondary | ICD-10-CM

## 2014-05-31 MED ORDER — CARVEDILOL 3.125 MG PO TABS: 3.1250 mg | ORAL_TABLET | Freq: Two times a day (BID) | ORAL | Status: AC

## 2014-05-31 NOTE — Patient Instructions (Signed)
Your physician recommends that you schedule a follow-up appointment in: 4-6 weeks with Dr.McDowell    Your physician has recommended you make the following change in your medication:    STOP Atenolol  START Coreg 3.125 mg twice a day- I called in to [facility]     Thank you for choosing [facility] !

## 2014-05-31 NOTE — Assessment & Plan Note (Signed)
Blood pressure control today.

## 2014-05-31 NOTE — Assessment & Plan Note (Signed)
As outlined above, plan is to stop atenolol and switch to Coreg 3.125 mg twice daily. Try and maintain at least a basic aerobic regimen such as walking on a regular basis to see if this helps improve stamina. If his symptoms of shortness of breath and fatigue persist or worsen, will we will consider heart catheterization to reassess bypass graft patency, although no significant ischemia by recent Cardiolite. LVEF 45-50% by echocardiogram with evidence of previous inferior infarct. Follow-up arranged in the next 4-6 weeks. Patient and family voiced agreement.

## 2014-05-31 NOTE — Progress Notes (Signed)
Reason for visit: CAD, follow-up testing  Clinical Summary Mr. Ptacek is a 73 y.o.male last seen in October. He has had fatigue and shortness of breath, does have a component of COPD, and we have also cut back his beta blocker dose. As noted below, LVEF has actually improved as well. Given persistent symptoms follow-up ischemic testing was arranged.  Lexiscan Cardiolite from November showed a large fixed inferolateral defect consistent with scar, no ischemia however, LVEF calculated 30% although better by recent echocardiogram. I reviewed the results with the patient, his wife, and also daughter today. Continues to report NYHA class III dyspnea, no angina however. Heart rate relatively slow, although in the setting of PVCs. He has had no palpitations or syncope.  Echocardiogram in September showed improvement in LVEF the range of 16-10%, grade 1 diastolic dysfunction, wall motion abnormalities consistent with known ischemic heart disease, mild mitral regurgitation, moderate left atrial enlargement, mildly reduced RV function, PASP 38 mm mercury.  Today we discussed changing from atenolol to very low-dose Coreg to see if this might help not limit his heart rate quite so much, but still provide benefit in terms of cardiomyopathy and CAD. We discussed trying to maintain some type of aerobic activity on a regular basis. He does state that he felt better after going through cardiac rehabilitation, and his symptoms have worsened since then. Will also discussed the possibility of proceeding to a heart catheterization eventually, if his symptoms persist.   Allergies  Allergen Reactions  . Neosporin [Neomycin-Polymyxin-Gramicidin]     Current Outpatient Prescriptions  Medication Sig Dispense Refill  . alfuzosin (UROXATRAL) 10 MG 24 hr tablet Take 10 mg by mouth daily with breakfast.     . amLODipine (NORVASC) 10 MG tablet Take 10 mg by mouth daily.    Marland Kitchen aspirin 81 MG tablet Take 81 mg by mouth  daily.    . Blood Glucose Monitoring Suppl (ONE TOUCH ULTRA 2) W/DEVICE KIT     . carvedilol (COREG) 3.125 MG tablet Take 1 tablet (3.125 mg total) by mouth 2 (two) times daily. 180 tablet 3  . finasteride (PROSCAR) 5 MG tablet Take 5 mg by mouth daily.    Marland Kitchen losartan-hydrochlorothiazide (HYZAAR) 100-12.5 MG per tablet Take 1 tablet by mouth daily.     . metFORMIN (GLUCOPHAGE) 1000 MG tablet Take 1,000 mg by mouth daily with breakfast.     . nitroGLYCERIN (NITROSTAT) 0.4 MG SL tablet Place 0.4 mg under the tongue every 5 (five) minutes as needed.    Marland Kitchen omeprazole (PRILOSEC) 20 MG capsule Take 20 mg by mouth as needed.    . ONE TOUCH ULTRA TEST test strip     . ONETOUCH DELICA LANCETS 96E MISC     . oxyCODONE (OXY IR/ROXICODONE) 5 MG immediate release tablet Take 5 mg by mouth as needed.     . pravastatin (PRAVACHOL) 40 MG tablet Take 40 mg by mouth daily.      No current facility-administered medications for this visit.    Past Medical History  Diagnosis Date  . Asthma   . Type 2 diabetes mellitus   . Essential hypertension, benign   . Coronary atherosclerosis of native coronary artery     Multivessel, LVEF approximately 40%  . Pulmonary nodules     Noted 2008  . Adenoma of left adrenal gland     Possible  . Mixed hyperlipidemia   . Hearing loss   . Stroke     Status post CABG with left-sided residua  Past Surgical History  Procedure Laterality Date  . Coronary artery bypass graft  07/17/11    [facility] [place] to LAD, SVG to second diagonal, SVG to first obtuse marginal  . Abdominal aortic aneurysm repair    . Hernia repair    . Appendectomy    . Arthroscopic right knee surgery      Social History Mr. Samaan reports that he quit smoking about 32 years ago. His smoking use included Cigarettes. He started smoking about 63 years ago. He has a .5 pack-year smoking history. He has quit using smokeless tobacco. His smokeless tobacco use included Chew. Mr. Schreiner reports  that he does not drink alcohol.  Review of Systems Complete review of systems negative except as otherwise outlined in the clinical summary and also the following. No orthopnea or PND. No claudication.  Physical Examination Filed Vitals:   05/31/14 0927  BP: 120/72  Pulse: 40   Filed Weights   05/31/14 0927  Weight: 213 lb 9.6 oz (96.888 kg)    Appears comfortable at rest.  HEENT: Conjunctiva and lids normal, oropharynx clear.  Neck: Supple, no elevated JVP or carotid bruits, no thyromegaly.  Thorax: Stable healed sternal incision.  Lungs: Clear to auscultation, diminished BS, nonlabored breathing at rest.  Cardiac: Regular rate and rhythm with ectopy, no S3, soft systolic murmur, no pericardial rub.  Extremities: No pitting edema, distal pulses 1-2+.    Problem List and Plan   Coronary atherosclerosis of native coronary artery As outlined above, plan is to stop atenolol and switch to Coreg 3.125 mg twice daily. Try and maintain at least a basic aerobic regimen such as walking on a regular basis to see if this helps improve stamina. If his symptoms of shortness of breath and fatigue persist or worsen, will we will consider heart catheterization to reassess bypass graft patency, although no significant ischemia by recent Cardiolite. LVEF 45-50% by echocardiogram with evidence of previous inferior infarct. Follow-up arranged in the next 4-6 weeks. Patient and family voiced agreement.  Secondary cardiomyopathy LVEF 45-50% by most recent echocardiogram, improved on medical therapy.  Essential hypertension, benign Blood pressure control today.    Satira Sark, M.D., F.A.C.C.

## 2014-05-31 NOTE — Assessment & Plan Note (Signed)
LVEF 45-50% by most recent echocardiogram, improved on medical therapy.

## 2014-07-07 ENCOUNTER — Encounter: Payer: Self-pay | Admitting: Cardiology

## 2014-07-07 ENCOUNTER — Ambulatory Visit (INDEPENDENT_AMBULATORY_CARE_PROVIDER_SITE_OTHER): Payer: Medicare HMO | Admitting: Cardiology

## 2014-07-07 VITALS — BP 136/86 | HR 70 | Ht 68.0 in | Wt 214.0 lb

## 2014-07-07 DIAGNOSIS — I2510 Atherosclerotic heart disease of native coronary artery without angina pectoris: Secondary | ICD-10-CM

## 2014-07-07 DIAGNOSIS — I429 Cardiomyopathy, unspecified: Secondary | ICD-10-CM

## 2014-07-07 NOTE — Progress Notes (Signed)
Reason for visit: CAD  Clinical Summary [name] is a 74 y.o.male last seen in December 2015. We have been managing him medically and made some adjustments at the last visit, also encouraged regular activity. He is here with his wife today for a follow-up visit. She states that he does seem to be more active, has been getting outdoors more. Overall things seem to be better on current regimen, and we discussed continuing observation at this point.  Lexiscan Cardiolite from November 2015 showed a large fixed inferolateral defect consistent with scar, no ischemia however, LVEF calculated 30% although better by echocardiogram. Echocardiogram in September 2015 showed improvement in LVEF the range of 83-38%, grade 1 diastolic dysfunction, wall motion abnormalities consistent with known ischemic heart disease, mild mitral regurgitation, moderate left atrial enlargement, mildly reduced RV function, PASP 38 mm mercury.  Allergies  Allergen Reactions  . Neosporin [Neomycin-Polymyxin-Gramicidin]     Current Outpatient Prescriptions  Medication Sig Dispense Refill  . alfuzosin (UROXATRAL) 10 MG 24 hr tablet Take 10 mg by mouth daily with breakfast.     . amLODipine (NORVASC) 10 MG tablet Take 10 mg by mouth daily.    Marland Kitchen aspirin 81 MG tablet Take 81 mg by mouth daily.    . Blood Glucose Monitoring Suppl (ONE TOUCH ULTRA 2) W/DEVICE KIT     . carvedilol (COREG) 3.125 MG tablet Take 1 tablet (3.125 mg total) by mouth 2 (two) times daily. 180 tablet 3  . finasteride (PROSCAR) 5 MG tablet Take 5 mg by mouth daily.    Marland Kitchen losartan-hydrochlorothiazide (HYZAAR) 100-12.5 MG per tablet Take 1 tablet by mouth daily.     . metFORMIN (GLUCOPHAGE) 1000 MG tablet Take 1,000 mg by mouth daily with breakfast.     . nitroGLYCERIN (NITROSTAT) 0.4 MG SL tablet Place 0.4 mg under the tongue every 5 (five) minutes as needed.    Marland Kitchen omeprazole (PRILOSEC) 20 MG capsule Take 20 mg by mouth as needed.    . ONE TOUCH ULTRA TEST  test strip     . ONETOUCH DELICA LANCETS 25K MISC     . oxyCODONE (OXY IR/ROXICODONE) 5 MG immediate release tablet Take 5 mg by mouth as needed.     . pravastatin (PRAVACHOL) 40 MG tablet Take 40 mg by mouth daily.      No current facility-administered medications for this visit.    Past Medical History  Diagnosis Date  . Asthma   . Type 2 diabetes mellitus   . Essential hypertension, benign   . Coronary atherosclerosis of native coronary artery     Multivessel, LVEF approximately 40%  . Pulmonary nodules     Noted 2008  . Adenoma of left adrenal gland     Possible  . Mixed hyperlipidemia   . Hearing loss   . Stroke     Status post CABG with left-sided residua    Past Surgical History  Procedure Laterality Date  . Coronary artery bypass graft  07/17/11    [facility] [place] to LAD, SVG to second diagonal, SVG to first obtuse marginal  . Abdominal aortic aneurysm repair    . Hernia repair    . Appendectomy    . Arthroscopic right knee surgery      Social History Mr. Blakeman reports that he quit smoking about 33 years ago. His smoking use included Cigarettes. He started smoking about 64 years ago. He has a .5 pack-year smoking history. He has quit using smokeless tobacco. His smokeless  tobacco use included Chew. Mr. Riddle reports that he does not drink alcohol.  Review of Systems Complete review of systems negative except as otherwise outlined in the clinical summary and also the following. Had some soreness of his left breast. States that they had a mammogram that was normal. Not on any obvious offending medications. Also episodes of spontaneous hives,  Plans to see an allergist for this.  Physical Examination Filed Vitals:   07/07/14 1003  BP: 136/86  Pulse: 70   Filed Weights   07/07/14 1003  Weight: 214 lb (97.07 kg)    Appears comfortable at rest.  HEENT: Conjunctiva and lids normal, oropharynx clear.  Neck: Supple, no elevated JVP or carotid bruits,  no thyromegaly.  Thorax: Stable healed sternal incision.  Lungs: Clear to auscultation, diminished BS, nonlabored breathing at rest.  Cardiac: Regular rate and rhythm with ectopy, no S3, soft systolic murmur, no pericardial rub.  Extremities: No pitting edema, distal pulses 1-2+.    Problem List and Plan   Coronary atherosclerosis of native coronary artery At this point stable on medical therapy. No changes were made today. I will see him back in 6 months, sooner if needed.   Secondary cardiomyopathy LVEF 45-50% by most recent echocardiogram. He is now on Coreg and Hyzaar.     Satira Sark, M.D., F.A.C.C.

## 2014-07-07 NOTE — Assessment & Plan Note (Signed)
At this point stable on medical therapy. No changes were made today. I will see him back in 6 months, sooner if needed.

## 2014-07-07 NOTE — Patient Instructions (Signed)
Your physician wants you to follow-up in: 6 Months with Dr. McDowell. You will receive a reminder letter in the mail two months in advance. If you don't receive a letter, please call our office to schedule the follow-up appointment.   Your physician recommends that you continue on your current medications as directed. Please refer to the Current Medication list given to you today.   Thank you for choosing [facility]!   

## 2014-07-07 NOTE — Assessment & Plan Note (Addendum)
LVEF 45-50% by most recent echocardiogram. He is now on Coreg and Hyzaar.

## 2014-09-23 ENCOUNTER — Telehealth: Payer: Self-pay | Admitting: Cardiology

## 2014-09-23 NOTE — Telephone Encounter (Signed)
Pt became upset  yesterday after he received call that someone was trying to get money for him for a grand child that was "supposedly" in jail.he clarified with police that it was not true but heart rate remained up all day.They did not check his HR with their BP machine.Wife  reports it happened again this am and they took HR upon my asking and it was 66.I spoke with Ms lawrence NP, and she recommends watching heart rate and try to relax.I instructed wife to call back if sx's became worse.Wife agreed with dispo

## 2015-01-11 ENCOUNTER — Encounter: Payer: Self-pay | Admitting: Cardiology

## 2015-01-11 ENCOUNTER — Ambulatory Visit (INDEPENDENT_AMBULATORY_CARE_PROVIDER_SITE_OTHER): Payer: Medicare HMO | Admitting: Cardiology

## 2015-01-11 VITALS — BP 126/70 | HR 97 | Ht 69.0 in | Wt 207.0 lb

## 2015-01-11 DIAGNOSIS — I10 Essential (primary) hypertension: Secondary | ICD-10-CM

## 2015-01-11 DIAGNOSIS — I2510 Atherosclerotic heart disease of native coronary artery without angina pectoris: Secondary | ICD-10-CM

## 2015-01-11 DIAGNOSIS — E782 Mixed hyperlipidemia: Secondary | ICD-10-CM

## 2015-01-11 NOTE — Patient Instructions (Signed)
Your physician wants you to follow-up in: 6 months You will receive a reminder letter in the mail two months in advance. If you don't receive a letter, please call our office to schedule the follow-up appointment.     Your physician recommends that you continue on your current medications as directed. Please refer to the Current Medication list given to you today.      Thank you for choosing [facility] !        

## 2015-01-11 NOTE — Progress Notes (Signed)
Cardiology Office Note  Date: 01/11/2015   ID: Allen Mason, DOB 1940-10-06, MRN [id number]  PCP: Neale Burly, MD  Primary Cardiologist: Rozann Lesches, MD   Chief Complaint  Patient presents with  . Coronary Artery Disease  . Hyperlipidemia    History of Present Illness: Allen Mason is a 74 y.o. male last seen in January. He is here with his wife today for a routine cardiac visit. Since I last saw him, he has switched his primary care to Dr. Sherrie Sport. He remains stable in terms of dyspnea on exertion, no progression, no angina symptoms. He continues to have an atypical left lateral thoracic discomfort that is noncardiac, possibly musculoskeletal.  We reviewed his cardiac medications which are outlined below. No significant changes overall. We are requesting most recent lab work drawn by Dr. Sherrie Sport.  Ischemic testing from November 2015 is outlined below. We continue to manage him medically.   Past Medical History  Diagnosis Date  . Asthma   . Type 2 diabetes mellitus   . Essential hypertension, benign   . Coronary atherosclerosis of native coronary artery     Multivessel, LVEF approximately 40%  . Pulmonary nodules     Noted 2008  . Adenoma of left adrenal gland     Possible  . Mixed hyperlipidemia   . Hearing loss   . Stroke     Status post CABG with left-sided residua  . Bladder cancer     Follows at [facility]  . Prostatic hypertrophy     Past Surgical History  Procedure Laterality Date  . Coronary artery bypass graft  07/17/11    [facility] [place] to LAD, SVG to second diagonal, SVG to first obtuse marginal  . Abdominal aortic aneurysm repair    . Hernia repair    . Appendectomy    . Arthroscopic right knee surgery      Current Outpatient Prescriptions  Medication Sig Dispense Refill  . alfuzosin (UROXATRAL) 10 MG 24 hr tablet Take 10 mg by mouth daily with breakfast.     . amLODipine (NORVASC) 10 MG tablet Take 10 mg by mouth daily.    Marland Kitchen  aspirin 81 MG tablet Take 81 mg by mouth daily.    . Blood Glucose Monitoring Suppl (ONE TOUCH ULTRA 2) W/DEVICE KIT     . carvedilol (COREG) 3.125 MG tablet Take 1 tablet (3.125 mg total) by mouth 2 (two) times daily. 180 tablet 3  . finasteride (PROSCAR) 5 MG tablet Take 5 mg by mouth daily.    . furosemide (LASIX) 20 MG tablet Take 20 mg by mouth as needed.    Marland Kitchen losartan-hydrochlorothiazide (HYZAAR) 100-12.5 MG per tablet Take 1 tablet by mouth daily.     . metFORMIN (GLUCOPHAGE) 1000 MG tablet Take 1,000 mg by mouth daily with breakfast.     . nitroGLYCERIN (NITROSTAT) 0.4 MG SL tablet Place 0.4 mg under the tongue every 5 (five) minutes as needed.    Marland Kitchen omeprazole (PRILOSEC) 20 MG capsule Take 20 mg by mouth as needed.    . ONE TOUCH ULTRA TEST test strip     . ONETOUCH DELICA LANCETS 69S MISC     . oxyCODONE (OXY IR/ROXICODONE) 5 MG immediate release tablet Take 5 mg by mouth as needed.     . pravastatin (PRAVACHOL) 40 MG tablet Take 40 mg by mouth daily.      No current facility-administered medications for this visit.    Allergies:  Neosporin  Social History: The patient  reports that he quit smoking about 33 years ago. His smoking use included Cigarettes. He started smoking about 64 years ago. He has a .5 pack-year smoking history. He has quit using smokeless tobacco. His smokeless tobacco use included Chew. He reports that he does not drink alcohol or use illicit drugs.   ROS:  Please see the history of present illness. Otherwise, complete review of systems is positive for nocturia.  All other systems are reviewed and negative.   Physical Exam: VS:  BP 126/70 mmHg  Pulse 97  Ht _0  (1.753 m)  Wt 207 lb (93.895 kg)  BMI 30.55 kg/m2  SpO2 97%, BMI Body mass index is 30.55 kg/(m^2).  Wt Readings from Last 3 Encounters:  01/11/15 207 lb (93.895 kg)  07/07/14 214 lb (97.07 kg)  05/31/14 213 lb 9.6 oz (96.888 kg)     Appears comfortable at rest.  HEENT: Conjunctiva and  lids normal, oropharynx clear.  Neck: Supple, no elevated JVP or carotid bruits, no thyromegaly.  Thorax: Stable healed sternal incision.  Lungs: Clear to auscultation, diminished BS, nonlabored breathing at rest.  Cardiac: Regular rate and rhythm with ectopy, no S3, soft systolic murmur, no pericardial rub.  Extremities: No pitting edema, distal pulses 1-2+.    ECG: ECG is not ordered today.  Other Studies Reviewed Today:  Lexiscan Cardiolite from November 2015 showed a large fixed inferolateral defect consistent with scar, no ischemia however, LVEF calculated 30% although better by echocardiogram.   Echocardiogram in September 2015 showed improvement in LVEF the range of 06-23%, grade 1 diastolic dysfunction, wall motion abnormalities consistent with known ischemic heart disease, mild mitral regurgitation, moderate left atrial enlargement, mildly reduced RV function, PASP 38 mm mercury.  ASSESSMENT AND PLAN:  1. Symptomatically stable CAD status post CABG in 2013 at [facility]. Plan to continue medical therapy and observation.  2. Essential hypertension, blood pressure control is reasonable today.  3. Hyperlipidemia, on Pravachol. Requesting most recent lab work from Dr. Sherrie Sport.  Current medicines were reviewed at length with the patient today.  Disposition: FU with me in 6 months.   Signed, Satira Sark, MD, [facility] 01/11/2015 9:21 AM    [place] [facility] [place]. [address], [place], [place] [address] Phone: [phone]; Fax: [phone]

## 2015-01-28 IMAGING — NM NM MYOCAR MULTI W/SPECT W/WALL MOTION & EF
2 series · 12 of 12 positions shown · non-contrast
Comparison: None.

CLINICAL DATA: 73-year-old male with a known history of coronary
artery disease referred for shortness of breath.

EXAM:
MYOCARDIAL IMAGING WITH SPECT (REST AND PHARMACOLOGIC-STRESS)
GATED LEFT VENTRICULAR WALL MOTION STUDY
LEFT VENTRICULAR EJECTION FRACTION
TECHNIQUE: Standard myocardial SPECT imaging was performed after resting
intravenous injection of 10 mCi 3c-55m sestamibi. Subsequently,
intravenous infusion of Lexiscan was performed under the supervision
of the Cardiology staff. At peak effect of the drug, 30 mCi 3c-55m
sestamibi was injected intravenously and standard myocardial SPECT
imaging was performed. Quantitative gated imaging was also performed
to evaluate left ventricular wall motion, and estimate left
ventricular ejection fraction.

[Series 1: rest · 8.28mm/px · 6 of 64 frames shown]
[frame 6/64]
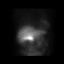
[frame 16/64]
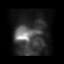
[frame 27/64]
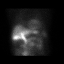
[frame 38/64]
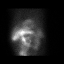
[frame 48/64]
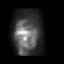
[frame 59/64]
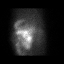

[Series 3: stress gated · 8.28mm/px · 6 of 64 frames shown]
[frame 6/64]
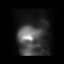
[frame 16/64]
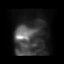
[frame 27/64]
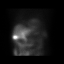
[frame 38/64]
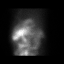
[frame 48/64]
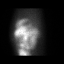
[frame 59/64]
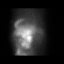

[12 of 12 positions shown; findings below may reference images not displayed]

FINDINGS: Pharmacological stress

Baseline EKG showed sinus rhythm with occasional PVCs. After
injection heart rate increased from 50 beats per min up to 67 beats
per min, and blood pressure increased from 133/73 up to 141/75. The
test was stopped after injection was complete, the patient did not
experience any chest pain. Post-injection EKG showed showed nose
specific ischemic changes and no significant arrhythmias.

Perfusion: There is a large severe intensity fixed inferolateral
wall defect. There are no other myocardial perfusion defects.

Wall Motion: The inferolateral wall was hypokinetic

Left Ventricular Ejection Fraction: 30 %

End diastolic volume 142 ml

End systolic volume 100 ml
IMPRESSION: 1. Large fixed inferolateral scar with no peri-infarct ischemia.

2. Inferolateral wall hypokinesis.

3. Left ventricular ejection fraction 30%

4. High-risk stress test findings*. High risk due to low ejection
and large fixed scar, there is not current myocardium at jeopardy.

*5045 Appropriate Use Criteria for Coronary Revascularization
Focused Update: J Am Coll Cardiol. 5045;59(9):857-881.
[URL]

## 2015-07-11 ENCOUNTER — Ambulatory Visit: Payer: Medicare HMO | Admitting: Cardiology

## 2015-08-10 ENCOUNTER — Encounter: Payer: Self-pay | Admitting: Cardiology

## 2015-08-10 ENCOUNTER — Ambulatory Visit (INDEPENDENT_AMBULATORY_CARE_PROVIDER_SITE_OTHER): Payer: Medicare HMO | Admitting: Cardiology

## 2015-08-10 VITALS — BP 100/50 | HR 45 | Ht 68.0 in | Wt 203.0 lb

## 2015-08-10 DIAGNOSIS — I429 Cardiomyopathy, unspecified: Secondary | ICD-10-CM | POA: Diagnosis not present

## 2015-08-10 DIAGNOSIS — I2510 Atherosclerotic heart disease of native coronary artery without angina pectoris: Secondary | ICD-10-CM

## 2015-08-10 DIAGNOSIS — I10 Essential (primary) hypertension: Secondary | ICD-10-CM | POA: Diagnosis not present

## 2015-08-10 DIAGNOSIS — E782 Mixed hyperlipidemia: Secondary | ICD-10-CM

## 2015-08-10 NOTE — Progress Notes (Signed)
Cardiology Office Note  Date: 08/10/2015   ID: KENNARD FILDES, DOB 1940/07/30, MRN [id number]  PCP: Neale Burly, MD  Primary Cardiologist: Rozann Lesches, MD   Chief Complaint  Patient presents with  . Coronary Artery Disease    History of Present Illness: Allen Mason is a 75 y.o. male last seen in July 2016. He is here today with his wife for a follow-up visit. I reviewed his chart and was surprised to find out that he was evaluated by Dr. Maylon Peppers at [facility] in January 2017 with reported progressive angina symptoms, ultimately undergoing a cardiac catheterization as detailed below with placement of DES in the circumflex. When I last saw him in July 2016 he was overall stable without progressive angina symptoms, and had undergone a Cardiolite study in November 2015 which revealed a large fixed inferolateral defect consistent with scar, no residual ischemia. The patient's wife tells me that Dr. Sherrie Sport apparently tried to contact our practice, and also the [facility] practice at [facility] prior to referring him to [facility]. I see no documentation in the chart that there was communication from Dr. Sherrie Sport, and am not certain about these details.  Mr. Soja tells me that he does feel better, less short of breath with activity, no definitive angina symptoms. He has been trying to walk for 10 minutes a [place] at the mall. He is not interested in cardiac rehabilitation, although I did offer him a referral.  I reviewed his medications which are outlined below. Cardiac regimen now includes aspirin, Brilinta, Norvasc, Coreg, Lasix, Hyzaar and Pravachol. Heart rate low today, but patient is not obviously symptomatic. We did discuss this, the next step would be stopping Coreg if he is having any functional limitations.  Blood pressure today is low normal on current therapy. He denies any dizziness or lightheadedness. Patient wife states that she is trying to work on changing their diet.  Past  Medical History  Diagnosis Date  . Asthma   . Type 2 diabetes mellitus ([place])   . Essential hypertension, benign   . Coronary atherosclerosis of native coronary artery     Multivessel, LVEF approximately 40%, DES to circumflex 06/2015 - Dr. Maylon Peppers [facility])  . Pulmonary nodules     Noted 2008  . Adenoma of left adrenal gland     Possible  . Mixed hyperlipidemia   . Hearing loss   . Stroke [facility])     Status post CABG with left-sided residua  . Bladder cancer ([place])     Follows at [facility]  . Prostatic hypertrophy     Past Surgical History  Procedure Laterality Date  . Coronary artery bypass graft  07/17/11    [facility] - LIMA to LAD, SVG to second diagonal, SVG to first obtuse marginal  . Abdominal aortic aneurysm repair    . Hernia repair    . Appendectomy    . Arthroscopic right knee surgery      Current Outpatient Prescriptions  Medication Sig Dispense Refill  . alfuzosin (UROXATRAL) 10 MG 24 hr tablet Take 10 mg by mouth daily with breakfast.     . amLODipine (NORVASC) 10 MG tablet Take 10 mg by mouth daily.    Marland Kitchen aspirin 81 MG tablet Take 81 mg by mouth daily.    . benzonatate (TESSALON) 200 MG capsule Take 200 mg by mouth 3 (three) times daily as needed for cough.    . Blood Glucose Monitoring Suppl (ONE TOUCH ULTRA 2) W/DEVICE KIT     .  carvedilol (COREG) 3.125 MG tablet Take 1 tablet (3.125 mg total) by mouth 2 (two) times daily. 180 tablet 3  . finasteride (PROSCAR) 5 MG tablet Take 5 mg by mouth daily.    . furosemide (LASIX) 20 MG tablet Take 20 mg by mouth as needed.    Marland Kitchen losartan-hydrochlorothiazide (HYZAAR) 100-12.5 MG per tablet Take 1 tablet by mouth daily.     . metFORMIN (GLUCOPHAGE) 1000 MG tablet Take 1,000 mg by mouth daily with breakfast.     . nitroGLYCERIN (NITROSTAT) 0.4 MG SL tablet Place 0.4 mg under the tongue every 5 (five) minutes as needed.    Marland Kitchen omeprazole (PRILOSEC) 20 MG capsule Take 20 mg by mouth as needed.    . ONE TOUCH ULTRA TEST test strip       . ONETOUCH DELICA LANCETS 26J MISC     . oxyCODONE (OXY IR/ROXICODONE) 5 MG immediate release tablet Take 5 mg by mouth as needed.     . pravastatin (PRAVACHOL) 40 MG tablet Take 40 mg by mouth daily.     . ticagrelor (BRILINTA) 90 MG TABS tablet Take 90 mg by mouth.     No current facility-administered medications for this visit.   Allergies:  Neosporin   Social History: The patient  reports that he quit smoking about 34 years ago. His smoking use included Cigarettes. He started smoking about 65 years ago. He has a .5 pack-year smoking history. He has quit using smokeless tobacco. His smokeless tobacco use included Chew. He reports that he does not drink alcohol or use illicit drugs.   ROS:  Please see the history of present illness. Otherwise, complete review of systems is positive for arthritic pains, stable dyspnea on exertion. He also has had recent cough and chest congestion, reportedly prescribed an antibiotic by Dr. Sherrie Sport, although has decided not to take it.  All other systems are reviewed and negative.   Physical Exam: VS:  BP 100/50 mmHg  Pulse 45  Ht 5' 8"  (1.727 m)  Wt 203 lb (92.08 kg)  BMI 30.87 kg/m2  SpO2 97%, BMI Body mass index is 30.87 kg/(m^2).  Wt Readings from Last 3 Encounters:  08/10/15 203 lb (92.08 kg)  01/11/15 207 lb (93.895 kg)  07/07/14 214 lb (97.07 kg)    Elderly male, appears comfortable at rest.  HEENT: Conjunctiva and lids normal, oropharynx clear.  Neck: Supple, no elevated JVP or carotid bruits, no thyromegaly.  Lungs: Clear to auscultation, diminished BS, nonlabored breathing at rest.  Cardiac: Regular rate and rhythm with ectopy, no S3, soft systolic murmur, no pericardial rub.  Extremities: No pitting edema, distal pulses 1-2+. Left radial arteriotomy site well healed. Skin: Warm and dry. Musculoskeletal: No kyphosis. Neuropsychiatric: Alert and oriented 3, affect appropriate.  ECG: I personally reviewed the prior tracing from  04/02/2014 which showed sinus bradycardia with prolonged PR interval, IVCD with leftward axis and repolarization abnormalities.  Recent Labwork:  June 2016: Cholesterol 141, triglycerides 98, HDL 37, LDL 84, hemoglobin A1c 7.1, BUN 20, creatinine 1.2, potassium 3.8, AST 16, ALT 17  Other Studies Reviewed Today:  Lexiscan Cardiolite November 2015:  Large fixed inferolateral defect consistent with scar, no ischemia however, LVEF calculated 30% although better by echocardiogram.   Echocardiogram September 2015: LVEF the range of 33-54%, grade 1 diastolic dysfunction, wall motion abnormalities consistent with known ischemic heart disease, mild mitral regurgitation, moderate left atrial enlargement, mildly reduced RV function, PASP 38 mm mercury.  Cardiac catheterization 07/26/2015 [facility]): CORONARY ANATOMY:  Intervention preceded by diagnostic cath: Yes   Cath Date and Site:07/26/2015 at [facility]   Coronary Dominance:Right    Vessel Segment Vessel TypePCI Pre Stenosis  Post Stenosis Description  (%) (%)      Proximal CircumflexNative Yes 800      Mid LADNative No100      1st Diagonal Native No100      1st Marginal Native No100      Mid RCANative No30       GRAFT ANGIOGRAPHY:    Material TypeStenosis1st  Insertion Sten 2nd   InsertionComment    (%) (%)     Left Internal MammaryMid LAD     Artery     Saphenous Vein 1st Marginal      Saphenous Vein 1st Diagonal    Assessment and Plan:  1. Symptomatically stable multivessel CAD status post CABG at [facility] in 2013, and just recently placement of DES to the circumflex by Dr. Maylon Peppers at [facility]. Plan is to continue current medical regimen, we are checking on Brilinta samples. He is not able to get this medication through the [facility] medical system. We may have to consider going to Plavix if this becomes a major limitation over the next year. I have encouraged him to continue a walking regimen and gradually build himself up. If his slow heart rate becomes a functional limitation, next step would be to stop Coreg altogether, although we have kept him on beta blocker therapy for concurrent management of cardiomyopathy. I have encouraged the patient and his wife to maintain contact with [place] if he develops any progressive symptomatology. Hopefully this will help to prevent further fragmentation of his cardiovascular care.  2. Essential hypertension, blood pressure is low normal today. He is asymptomatic. Continue observation.  3. History of cardiomyopathy, LVEF 45-50% by echocardiogram in November 2015. Weight is down 4 pounds compared to July 2016, no clear evidence of volume overload.  4. Hyperlipidemia, on statin therapy. Most recent LDL 84. No changes made to current  regimen.  Current medicines were reviewed with the patient today.  Disposition: FU with me in 3 months.   Signed, Satira Sark, MD, [place] 08/10/2015 12:21 PM    [place] at [facility] [address] [place], [place] [address] Phone: [phone]; Fax: [phone]

## 2015-08-10 NOTE — Patient Instructions (Signed)
Your physician recommends that you schedule a follow-up appointment in:  3 months with Dr McDowell     Your physician recommends that you continue on your current medications as directed. Please refer to the Current Medication list given to you today.      Thank you for choosing [facility] !         

## 2015-08-11 ENCOUNTER — Other Ambulatory Visit: Payer: Self-pay | Admitting: Cardiology

## 2015-08-11 MED ORDER — TICAGRELOR 90 MG PO TABS: 90.0000 mg | ORAL_TABLET | Freq: Two times a day (BID) | ORAL | Status: AC

## 2015-08-11 NOTE — Telephone Encounter (Signed)
Requesting samples of Brilinta 90 mg BID / tg

## 2015-08-11 NOTE — Telephone Encounter (Signed)
Pt came by office and picked up Brilinta samples

## 2015-09-26 ENCOUNTER — Telehealth: Payer: Self-pay | Admitting: Cardiology

## 2015-09-26 NOTE — Telephone Encounter (Signed)
I did not speak with patient however, I told her to call back and see if we could ascertain what she needs

## 2015-09-26 NOTE — Telephone Encounter (Signed)
Patient was cut off of your conversation.  She said you can call her at [phone] as she will be heading to [place] this afternoon.

## 2015-11-10 ENCOUNTER — Encounter: Payer: Self-pay | Admitting: Cardiology

## 2015-11-10 ENCOUNTER — Ambulatory Visit (INDEPENDENT_AMBULATORY_CARE_PROVIDER_SITE_OTHER): Payer: Medicare HMO | Admitting: Cardiology

## 2015-11-10 VITALS — BP 106/70 | HR 72 | Ht 68.0 in | Wt 186.0 lb

## 2015-11-10 DIAGNOSIS — I10 Essential (primary) hypertension: Secondary | ICD-10-CM

## 2015-11-10 DIAGNOSIS — I255 Ischemic cardiomyopathy: Secondary | ICD-10-CM

## 2015-11-10 DIAGNOSIS — I2510 Atherosclerotic heart disease of native coronary artery without angina pectoris: Secondary | ICD-10-CM

## 2015-11-10 DIAGNOSIS — E782 Mixed hyperlipidemia: Secondary | ICD-10-CM | POA: Diagnosis not present

## 2015-11-10 NOTE — Progress Notes (Signed)
Cardiology Office Note  Date: 11/10/2015   ID: Allen Mason, DOB 1940/11/29, MRN [id number]  PCP: Neale Burly, MD  Primary Cardiologist: Rozann Lesches, MD   Chief Complaint  Patient presents with  . Coronary Artery Disease    History of Present Illness: Allen Mason is a 75 y.o. male last seen in February.He presents with his wife for a follow-up visit. I reviewed his records, he saw Dr. Maylon Peppers at [facility] in April for a follow-up of his prior DES intervention to the circumflex in January. Also had an echocardiogram at that time which is outlined below and showed overall stable LVEF at 40-45%. Lasix dose was increased to 40 mg daily.  He still reports generalized fatigue, chronic dyspnea on exertion, no angina symptoms. He reports compliance with his medications. He and his wife have both been dieting, cutting back caloric intake, and he has lost around 20 pounds.  Present cardiac regimen includes aspirin, Brilinta, Coreg, Lasix, Hyzaar, and Pravachol. He does report easy bruising but no substantial bleeding problems. His blood pressure is well controlled today.  Past Medical History  Diagnosis Date  . Asthma   . Type 2 diabetes mellitus ([place])   . Essential hypertension, benign   . Coronary atherosclerosis of native coronary artery     Multivessel, LVEF approximately 40%, DES to circumflex 06/2015 - Dr. Maylon Peppers [facility])  . Pulmonary nodules     Noted 2008  . Adenoma of left adrenal gland     Possible  . Mixed hyperlipidemia   . Hearing loss   . Stroke [facility])     Status post CABG with left-sided residua  . Bladder cancer ([place])     Follows at [facility]  . Prostatic hypertrophy     Past Surgical History  Procedure Laterality Date  . Coronary artery bypass graft  07/17/11    [facility] - LIMA to LAD, SVG to second diagonal, SVG to first obtuse marginal  . Abdominal aortic aneurysm repair    . Hernia repair    . Appendectomy    . Arthroscopic right knee surgery       Current Outpatient Prescriptions  Medication Sig Dispense Refill  . amLODipine (NORVASC) 10 MG tablet Take 10 mg by mouth daily.    Marland Kitchen aspirin 81 MG tablet Take 81 mg by mouth daily.    . benzonatate (TESSALON) 200 MG capsule Take 200 mg by mouth 3 (three) times daily as needed for cough.    . Blood Glucose Monitoring Suppl (ONE TOUCH ULTRA 2) W/DEVICE KIT     . carvedilol (COREG) 3.125 MG tablet Take 1 tablet (3.125 mg total) by mouth 2 (two) times daily. 180 tablet 3  . finasteride (PROSCAR) 5 MG tablet Take 5 mg by mouth daily.    . furosemide (LASIX) 20 MG tablet Take 40 mg by mouth as needed.     Marland Kitchen losartan-hydrochlorothiazide (HYZAAR) 100-12.5 MG per tablet Take 1 tablet by mouth daily.     . metFORMIN (GLUCOPHAGE) 1000 MG tablet Take 1,000 mg by mouth daily with breakfast.     . nitroGLYCERIN (NITROSTAT) 0.4 MG SL tablet Place 0.4 mg under the tongue every 5 (five) minutes as needed.    Marland Kitchen omeprazole (PRILOSEC) 20 MG capsule Take 20 mg by mouth as needed.    . ONE TOUCH ULTRA TEST test strip     . ONETOUCH DELICA LANCETS 49Z MISC     . oxyCODONE (OXY IR/ROXICODONE) 5 MG immediate release tablet Take  5 mg by mouth as needed.     . pravastatin (PRAVACHOL) 40 MG tablet Take 40 mg by mouth daily.     . ticagrelor (BRILINTA) 90 MG TABS tablet Take 1 tablet (90 mg total) by mouth 2 (two) times daily. 32 tablet 0   No current facility-administered medications for this visit.   Allergies:  Neosporin   Social History: The patient  reports that he quit smoking about 34 years ago. His smoking use included Cigarettes. He started smoking about 65 years ago. He has a .5 pack-year smoking history. He has quit using smokeless tobacco. His smokeless tobacco use included Chew. He reports that he does not drink alcohol or use illicit drugs.   ROS:  Please see the history of present illness. Otherwise, complete review of systems is positive for arthritic stiffness.  All other systems are reviewed and  negative.   Physical Exam: VS:  BP 106/70 mmHg  Pulse 72  Ht _0  (1.727 m)  Wt 186 lb (84.369 kg)  BMI 28.29 kg/m2  SpO2 91%, BMI Body mass index is 28.29 kg/(m^2).  Wt Readings from Last 3 Encounters:  11/10/15 186 lb (84.369 kg)  08/10/15 203 lb (92.08 kg)  01/11/15 207 lb (93.895 kg)    Elderly male, appears comfortable at rest.  HEENT: Conjunctiva and lids normal, oropharynx clear.  Neck: Supple, no elevated JVP or carotid bruits, no thyromegaly.  Lungs: Clear to auscultation, diminished BS, nonlabored breathing at rest.  Cardiac: Regular rate and rhythm with ectopy, no S3, soft systolic murmur, no pericardial rub.  Extremities: No pitting edema, distal pulses 1-2+. Left radial arteriotomy site well healed. Skin: Warm and dry. Musculoskeletal: No kyphosis. Neuropsychiatric: Alert and oriented 3, affect appropriate.  ECG: I personally reviewed the prior tracing from 04/02/2014 which showed sinus bradycardia with prolonged PR interval, IVCD, PVC, and nonspecific ST changes.  Recent Labwork:  June 2016: Cholesterol 141, triglycerides 98, HDL 37, LDL 84, hemoglobin A1c 7.1, BUN 20, creatinine 1.2, potassium 3.8, AST 16, ALT 17  Other Studies Reviewed Today:  Lexiscan Cardiolite November 2015:  Large fixed inferolateral defect consistent with scar, no ischemia however, LVEF calculated 30% although better by echocardiogram.   Echocardiogram 10/18/2015 [facility]): SUMMARY The left ventricular size is normal. There is mild concentric left ventricular hypertrophy.  Left ventricular systolic function is mildly reduced. LV ejection fraction = 40-45%.  Left ventricular filling pattern is impaired. There are regional wall motion abnormalities as specified below. There is hypokinesis of the LCx territory involving the basal and mid  segment(s). The right ventricular systolic function is mildly reduced. There is mild mitral regurgitation. There is no pericardial  effusion. There is no comparison study available. - FINDINGS:  LEFT VENTRICLE The left ventricular size is normal. There is mild concentric left  ventricular hypertrophy. LV Global L Strain =-13.6%. LV ejection fraction =  40-45%. Left ventricular systolic function is mildly reduced. Left  ventricular filling pattern is impaired. There are regional wall motion  abnormalities as specified below. There is basal LV posterior wall mild  hypokinesis. There is basal LV lateral wall mild hypokinesis. There is mid LV  lateral wall mild hypokinesis. There is basal LV inferior wall mild  hypokinesis. There is hypokinesis of the LCx territory involving the basal  and mid segment(s). -  RIGHT VENTRICLE The right ventricle is normal size. The right ventricular systolic function  is mildly reduced.  LEFT ATRIUM The left atrium is mildly dilated.  RIGHT ATRIUM The right atrium  is mildly dilated. The interatrial septum is intact with no  evidence for an atrial septal defect. - AORTIC VALVE There is aortic valve sclerosis. The aortic valve is trileaflet. There is  trace aortic regurgitation. - MITRAL VALVE There is mild mitral annular calcification. There is mild mitral  regurgitation. - TRICUSPID VALVE Structurally normal tricuspid valve. There is trace tricuspid regurgitation. - PULMONIC VALVE Structurally normal pulmonic valve. Mild pulmonic valvular regurgitation. - ARTERIES Borderline aortic root dilatation. - VENOUS Pulmonary venous flow pattern not well visualized. IVC size was normal. - EFFUSION There is no pericardial effusion.  Cardiac catheterization 07/26/2015 [facility]): CORONARY ANATOMY:   Intervention preceded by diagnostic cath: Yes   Cath Date and Site:07/26/2015 at [facility]   Coronary Dominance:Right    Vessel Segment Vessel TypePCI Pre Stenosis  Post Stenosis  Description  (%) (%)      Proximal CircumflexNative Yes 800      Mid LADNative No100      1st Diagonal Native No100      1st Marginal Native No100      Mid RCANative No30       GRAFT ANGIOGRAPHY:    Material TypeStenosis1st Insertion Sten 2nd   InsertionComment    (%) (%)     Left Internal MammaryMid LAD     Artery     Saphenous Vein 1st Marginal      Saphenous Vein 1st Diagonal  Assessment and Plan:  1. Multivessel CAD status post CABG and subsequent DES intervention of the circumflex as outlined above. He is following with Dr. Maylon Peppers at [facility] and also through our practice. He does not report any active angina on present regimen. Continues on DAPT.  2. Ischemic cardiomyopathy with LVEF 40-45% by recent echocardiogram at [facility]. Lasix at 40 mg daily. He has been concurrently working on weight loss through  caloric restriction, also salt restriction.  3. Essential hypertension, blood pressure is well controlled today.  4. Hyperlipidemia, continues on Pravachol.  Current medicines were reviewed with the patient today.   Orders Placed This Encounter  Procedures  . EKG 12-Lead    Disposition: FU with mwe in 6 months.   Signed, Satira Sark, MD, [facility] 11/10/2015 9:44 AM    [place] at [address] [place], [place] [address] Phone: [phone]; Fax: [phone]

## 2015-11-10 NOTE — Patient Instructions (Signed)
Your physician wants you to follow-up in: 6 months You will receive a reminder letter in the mail two months in advance. If you don't receive a letter, please call our office to schedule the follow-up appointment.     Your physician recommends that you continue on your current medications as directed. Please refer to the Current Medication list given to you today.      Thank you for choosing [facility] !        

## 2016-08-08 DIAGNOSIS — H2512 Age-related nuclear cataract, left eye: Secondary | ICD-10-CM | POA: Diagnosis not present

## 2016-09-18 DIAGNOSIS — R339 Retention of urine, unspecified: Secondary | ICD-10-CM | POA: Diagnosis not present

## 2016-09-18 DIAGNOSIS — E119 Type 2 diabetes mellitus without complications: Secondary | ICD-10-CM | POA: Diagnosis not present

## 2016-09-18 DIAGNOSIS — Z8673 Personal history of transient ischemic attack (TIA), and cerebral infarction without residual deficits: Secondary | ICD-10-CM | POA: Diagnosis not present

## 2016-09-18 DIAGNOSIS — Z87442 Personal history of urinary calculi: Secondary | ICD-10-CM | POA: Diagnosis not present

## 2016-09-18 DIAGNOSIS — I252 Old myocardial infarction: Secondary | ICD-10-CM | POA: Diagnosis not present

## 2016-09-18 DIAGNOSIS — Z79899 Other long term (current) drug therapy: Secondary | ICD-10-CM | POA: Diagnosis not present

## 2016-09-18 DIAGNOSIS — Z7984 Long term (current) use of oral hypoglycemic drugs: Secondary | ICD-10-CM | POA: Diagnosis not present

## 2016-09-18 DIAGNOSIS — I10 Essential (primary) hypertension: Secondary | ICD-10-CM | POA: Diagnosis not present

## 2016-09-18 DIAGNOSIS — Z7982 Long term (current) use of aspirin: Secondary | ICD-10-CM | POA: Diagnosis not present

## 2016-09-18 DIAGNOSIS — C679 Malignant neoplasm of bladder, unspecified: Secondary | ICD-10-CM | POA: Diagnosis not present

## 2016-09-19 DIAGNOSIS — K5909 Other constipation: Secondary | ICD-10-CM | POA: Diagnosis not present

## 2016-10-22 DIAGNOSIS — I2510 Atherosclerotic heart disease of native coronary artery without angina pectoris: Secondary | ICD-10-CM | POA: Diagnosis not present

## 2016-10-22 DIAGNOSIS — I10 Essential (primary) hypertension: Secondary | ICD-10-CM | POA: Diagnosis not present

## 2016-10-22 DIAGNOSIS — E1159 Type 2 diabetes mellitus with other circulatory complications: Secondary | ICD-10-CM | POA: Diagnosis not present

## 2016-10-22 DIAGNOSIS — Z1389 Encounter for screening for other disorder: Secondary | ICD-10-CM | POA: Diagnosis not present

## 2016-10-22 DIAGNOSIS — E784 Other hyperlipidemia: Secondary | ICD-10-CM | POA: Diagnosis not present

## 2016-10-22 DIAGNOSIS — Z0000 Encounter for general adult medical examination without abnormal findings: Secondary | ICD-10-CM | POA: Diagnosis not present

## 2016-10-22 DIAGNOSIS — N182 Chronic kidney disease, stage 2 (mild): Secondary | ICD-10-CM | POA: Diagnosis not present

## 2016-10-22 DIAGNOSIS — E1165 Type 2 diabetes mellitus with hyperglycemia: Secondary | ICD-10-CM | POA: Diagnosis not present

## 2016-10-22 DIAGNOSIS — N400 Enlarged prostate without lower urinary tract symptoms: Secondary | ICD-10-CM | POA: Diagnosis not present

## 2016-11-20 DIAGNOSIS — E1159 Type 2 diabetes mellitus with other circulatory complications: Secondary | ICD-10-CM | POA: Diagnosis not present

## 2016-12-27 DIAGNOSIS — I10 Essential (primary) hypertension: Secondary | ICD-10-CM | POA: Diagnosis not present

## 2016-12-27 DIAGNOSIS — R42 Dizziness and giddiness: Secondary | ICD-10-CM | POA: Diagnosis not present

## 2016-12-27 DIAGNOSIS — Z87891 Personal history of nicotine dependence: Secondary | ICD-10-CM | POA: Diagnosis not present

## 2016-12-27 DIAGNOSIS — Z951 Presence of aortocoronary bypass graft: Secondary | ICD-10-CM | POA: Diagnosis not present

## 2016-12-27 DIAGNOSIS — Z955 Presence of coronary angioplasty implant and graft: Secondary | ICD-10-CM | POA: Diagnosis not present

## 2016-12-27 DIAGNOSIS — Z7982 Long term (current) use of aspirin: Secondary | ICD-10-CM | POA: Diagnosis not present

## 2016-12-27 DIAGNOSIS — I2510 Atherosclerotic heart disease of native coronary artery without angina pectoris: Secondary | ICD-10-CM | POA: Diagnosis not present

## 2017-01-21 DIAGNOSIS — E1159 Type 2 diabetes mellitus with other circulatory complications: Secondary | ICD-10-CM | POA: Diagnosis not present

## 2017-01-21 DIAGNOSIS — N400 Enlarged prostate without lower urinary tract symptoms: Secondary | ICD-10-CM | POA: Diagnosis not present

## 2017-01-21 DIAGNOSIS — N182 Chronic kidney disease, stage 2 (mild): Secondary | ICD-10-CM | POA: Diagnosis not present

## 2017-01-21 DIAGNOSIS — I2510 Atherosclerotic heart disease of native coronary artery without angina pectoris: Secondary | ICD-10-CM | POA: Diagnosis not present

## 2017-01-21 DIAGNOSIS — I10 Essential (primary) hypertension: Secondary | ICD-10-CM | POA: Diagnosis not present

## 2017-04-18 DIAGNOSIS — M5134 Other intervertebral disc degeneration, thoracic region: Secondary | ICD-10-CM | POA: Diagnosis not present

## 2017-04-18 DIAGNOSIS — M4804 Spinal stenosis, thoracic region: Secondary | ICD-10-CM | POA: Diagnosis not present

## 2017-04-18 DIAGNOSIS — M5136 Other intervertebral disc degeneration, lumbar region: Secondary | ICD-10-CM | POA: Diagnosis not present

## 2017-04-18 DIAGNOSIS — M47816 Spondylosis without myelopathy or radiculopathy, lumbar region: Secondary | ICD-10-CM | POA: Diagnosis not present

## 2017-04-18 DIAGNOSIS — N200 Calculus of kidney: Secondary | ICD-10-CM | POA: Diagnosis not present

## 2017-04-18 DIAGNOSIS — M5033 Other cervical disc degeneration, cervicothoracic region: Secondary | ICD-10-CM | POA: Diagnosis not present

## 2017-04-18 DIAGNOSIS — M545 Low back pain: Secondary | ICD-10-CM | POA: Diagnosis not present

## 2017-04-23 DIAGNOSIS — E0829 Diabetes mellitus due to underlying condition with other diabetic kidney complication: Secondary | ICD-10-CM | POA: Diagnosis not present

## 2017-04-23 DIAGNOSIS — M545 Low back pain: Secondary | ICD-10-CM | POA: Diagnosis not present

## 2017-04-23 DIAGNOSIS — K219 Gastro-esophageal reflux disease without esophagitis: Secondary | ICD-10-CM | POA: Diagnosis not present

## 2017-04-23 DIAGNOSIS — N184 Chronic kidney disease, stage 4 (severe): Secondary | ICD-10-CM | POA: Diagnosis not present

## 2017-04-23 DIAGNOSIS — E785 Hyperlipidemia, unspecified: Secondary | ICD-10-CM | POA: Diagnosis not present

## 2017-04-23 DIAGNOSIS — E7849 Other hyperlipidemia: Secondary | ICD-10-CM | POA: Diagnosis not present

## 2017-04-25 DIAGNOSIS — Z951 Presence of aortocoronary bypass graft: Secondary | ICD-10-CM | POA: Diagnosis not present

## 2017-04-25 DIAGNOSIS — Z87891 Personal history of nicotine dependence: Secondary | ICD-10-CM | POA: Diagnosis not present

## 2017-04-25 DIAGNOSIS — I2510 Atherosclerotic heart disease of native coronary artery without angina pectoris: Secondary | ICD-10-CM | POA: Diagnosis not present

## 2017-04-25 DIAGNOSIS — Z955 Presence of coronary angioplasty implant and graft: Secondary | ICD-10-CM | POA: Diagnosis not present

## 2017-04-25 DIAGNOSIS — Z7901 Long term (current) use of anticoagulants: Secondary | ICD-10-CM | POA: Diagnosis not present

## 2017-04-25 DIAGNOSIS — Z7982 Long term (current) use of aspirin: Secondary | ICD-10-CM | POA: Diagnosis not present

## 2017-05-22 DIAGNOSIS — I272 Pulmonary hypertension, unspecified: Secondary | ICD-10-CM | POA: Diagnosis not present

## 2017-05-22 DIAGNOSIS — I340 Nonrheumatic mitral (valve) insufficiency: Secondary | ICD-10-CM | POA: Diagnosis not present

## 2017-05-22 DIAGNOSIS — I371 Nonrheumatic pulmonary valve insufficiency: Secondary | ICD-10-CM | POA: Diagnosis not present

## 2017-05-22 DIAGNOSIS — I361 Nonrheumatic tricuspid (valve) insufficiency: Secondary | ICD-10-CM | POA: Diagnosis not present

## 2017-05-22 DIAGNOSIS — I350 Nonrheumatic aortic (valve) stenosis: Secondary | ICD-10-CM | POA: Diagnosis not present

## 2017-05-22 DIAGNOSIS — I517 Cardiomegaly: Secondary | ICD-10-CM | POA: Diagnosis not present

## 2017-05-22 DIAGNOSIS — I5022 Chronic systolic (congestive) heart failure: Secondary | ICD-10-CM | POA: Diagnosis not present

## 2017-06-04 DIAGNOSIS — J40 Bronchitis, not specified as acute or chronic: Secondary | ICD-10-CM | POA: Diagnosis not present

## 2017-06-26 DIAGNOSIS — N184 Chronic kidney disease, stage 4 (severe): Secondary | ICD-10-CM | POA: Diagnosis not present

## 2017-06-26 DIAGNOSIS — I5022 Chronic systolic (congestive) heart failure: Secondary | ICD-10-CM | POA: Diagnosis not present

## 2017-06-26 DIAGNOSIS — E1165 Type 2 diabetes mellitus with hyperglycemia: Secondary | ICD-10-CM | POA: Diagnosis not present

## 2017-06-26 DIAGNOSIS — E7849 Other hyperlipidemia: Secondary | ICD-10-CM | POA: Diagnosis not present

## 2017-07-30 DIAGNOSIS — N184 Chronic kidney disease, stage 4 (severe): Secondary | ICD-10-CM | POA: Diagnosis not present

## 2017-07-30 DIAGNOSIS — E7849 Other hyperlipidemia: Secondary | ICD-10-CM | POA: Diagnosis not present

## 2017-07-30 DIAGNOSIS — I5022 Chronic systolic (congestive) heart failure: Secondary | ICD-10-CM | POA: Diagnosis not present

## 2017-07-30 DIAGNOSIS — E1165 Type 2 diabetes mellitus with hyperglycemia: Secondary | ICD-10-CM | POA: Diagnosis not present

## 2017-09-30 DIAGNOSIS — N184 Chronic kidney disease, stage 4 (severe): Secondary | ICD-10-CM | POA: Diagnosis not present

## 2017-09-30 DIAGNOSIS — E7849 Other hyperlipidemia: Secondary | ICD-10-CM | POA: Diagnosis not present

## 2017-09-30 DIAGNOSIS — I5022 Chronic systolic (congestive) heart failure: Secondary | ICD-10-CM | POA: Diagnosis not present

## 2017-09-30 DIAGNOSIS — E1165 Type 2 diabetes mellitus with hyperglycemia: Secondary | ICD-10-CM | POA: Diagnosis not present

## 2018-01-06 DIAGNOSIS — N184 Chronic kidney disease, stage 4 (severe): Secondary | ICD-10-CM | POA: Diagnosis not present

## 2018-01-06 DIAGNOSIS — Z0000 Encounter for general adult medical examination without abnormal findings: Secondary | ICD-10-CM | POA: Diagnosis not present

## 2018-01-06 DIAGNOSIS — I5022 Chronic systolic (congestive) heart failure: Secondary | ICD-10-CM | POA: Diagnosis not present

## 2018-01-06 DIAGNOSIS — E7849 Other hyperlipidemia: Secondary | ICD-10-CM | POA: Diagnosis not present

## 2018-01-06 DIAGNOSIS — E1165 Type 2 diabetes mellitus with hyperglycemia: Secondary | ICD-10-CM | POA: Diagnosis not present

## 2018-01-07 DIAGNOSIS — Z0000 Encounter for general adult medical examination without abnormal findings: Secondary | ICD-10-CM | POA: Diagnosis not present

## 2018-01-07 DIAGNOSIS — E7849 Other hyperlipidemia: Secondary | ICD-10-CM | POA: Diagnosis not present

## 2018-01-07 DIAGNOSIS — E1165 Type 2 diabetes mellitus with hyperglycemia: Secondary | ICD-10-CM | POA: Diagnosis not present

## 2018-01-07 DIAGNOSIS — N184 Chronic kidney disease, stage 4 (severe): Secondary | ICD-10-CM | POA: Diagnosis not present

## 2018-01-22 DIAGNOSIS — R195 Other fecal abnormalities: Secondary | ICD-10-CM | POA: Diagnosis not present

## 2018-03-13 DIAGNOSIS — D123 Benign neoplasm of transverse colon: Secondary | ICD-10-CM | POA: Diagnosis not present

## 2018-03-13 DIAGNOSIS — I252 Old myocardial infarction: Secondary | ICD-10-CM | POA: Diagnosis not present

## 2018-03-13 DIAGNOSIS — D122 Benign neoplasm of ascending colon: Secondary | ICD-10-CM | POA: Diagnosis not present

## 2018-03-13 DIAGNOSIS — R195 Other fecal abnormalities: Secondary | ICD-10-CM | POA: Diagnosis not present

## 2018-03-13 DIAGNOSIS — Z87442 Personal history of urinary calculi: Secondary | ICD-10-CM | POA: Diagnosis not present

## 2018-03-13 DIAGNOSIS — Z8673 Personal history of transient ischemic attack (TIA), and cerebral infarction without residual deficits: Secondary | ICD-10-CM | POA: Diagnosis not present

## 2018-03-13 DIAGNOSIS — Z8551 Personal history of malignant neoplasm of bladder: Secondary | ICD-10-CM | POA: Diagnosis not present

## 2018-03-13 DIAGNOSIS — D120 Benign neoplasm of cecum: Secondary | ICD-10-CM | POA: Diagnosis not present

## 2018-03-13 DIAGNOSIS — M1990 Unspecified osteoarthritis, unspecified site: Secondary | ICD-10-CM | POA: Diagnosis not present

## 2018-03-13 DIAGNOSIS — E119 Type 2 diabetes mellitus without complications: Secondary | ICD-10-CM | POA: Diagnosis not present

## 2018-03-13 DIAGNOSIS — Z951 Presence of aortocoronary bypass graft: Secondary | ICD-10-CM | POA: Diagnosis not present

## 2018-03-13 DIAGNOSIS — K644 Residual hemorrhoidal skin tags: Secondary | ICD-10-CM | POA: Diagnosis not present

## 2018-03-13 DIAGNOSIS — K921 Melena: Secondary | ICD-10-CM | POA: Diagnosis not present

## 2018-03-13 DIAGNOSIS — Z888 Allergy status to other drugs, medicaments and biological substances status: Secondary | ICD-10-CM | POA: Diagnosis not present

## 2018-03-13 DIAGNOSIS — K5730 Diverticulosis of large intestine without perforation or abscess without bleeding: Secondary | ICD-10-CM | POA: Diagnosis not present

## 2018-03-13 DIAGNOSIS — D139 Benign neoplasm of ill-defined sites within the digestive system: Secondary | ICD-10-CM | POA: Diagnosis not present

## 2018-03-13 DIAGNOSIS — K641 Second degree hemorrhoids: Secondary | ICD-10-CM | POA: Diagnosis not present

## 2018-03-13 DIAGNOSIS — I10 Essential (primary) hypertension: Secondary | ICD-10-CM | POA: Diagnosis not present

## 2018-03-31 DIAGNOSIS — D126 Benign neoplasm of colon, unspecified: Secondary | ICD-10-CM | POA: Diagnosis not present

## 2018-04-07 DIAGNOSIS — S51011A Laceration without foreign body of right elbow, initial encounter: Secondary | ICD-10-CM | POA: Diagnosis not present

## 2018-04-08 DIAGNOSIS — H40052 Ocular hypertension, left eye: Secondary | ICD-10-CM | POA: Diagnosis not present

## 2018-04-11 ENCOUNTER — Other Ambulatory Visit: Payer: Self-pay

## 2018-04-11 DIAGNOSIS — H2512 Age-related nuclear cataract, left eye: Secondary | ICD-10-CM | POA: Diagnosis not present

## 2018-04-11 DIAGNOSIS — H26491 Other secondary cataract, right eye: Secondary | ICD-10-CM | POA: Diagnosis not present

## 2018-04-11 DIAGNOSIS — H25012 Cortical age-related cataract, left eye: Secondary | ICD-10-CM | POA: Diagnosis not present

## 2018-04-11 DIAGNOSIS — H401134 Primary open-angle glaucoma, bilateral, indeterminate stage: Secondary | ICD-10-CM | POA: Diagnosis not present

## 2018-04-11 NOTE — Patient Outreach (Signed)
[facility]) Care Management  04/11/2018  [name] [birth year] [id number]   Medication Adherence call to Allen Mason spoke with patient he said his wife takes care of his medication but wife is not home he wants a call back after 5pm. patient is due on Pravastatin 80 mg.Allen Mason is showing past due under [place].  [facility] Direct Dial [phone]  Fax [phone] [name]

## 2018-04-15 DIAGNOSIS — E7849 Other hyperlipidemia: Secondary | ICD-10-CM | POA: Diagnosis not present

## 2018-04-15 DIAGNOSIS — E11610 Type 2 diabetes mellitus with diabetic neuropathic arthropathy: Secondary | ICD-10-CM | POA: Diagnosis not present

## 2018-04-15 DIAGNOSIS — Z1389 Encounter for screening for other disorder: Secondary | ICD-10-CM | POA: Diagnosis not present

## 2018-04-15 DIAGNOSIS — Z0000 Encounter for general adult medical examination without abnormal findings: Secondary | ICD-10-CM | POA: Diagnosis not present

## 2018-04-15 DIAGNOSIS — N182 Chronic kidney disease, stage 2 (mild): Secondary | ICD-10-CM | POA: Diagnosis not present

## 2018-04-15 DIAGNOSIS — I10 Essential (primary) hypertension: Secondary | ICD-10-CM | POA: Diagnosis not present

## 2018-04-17 DIAGNOSIS — H25012 Cortical age-related cataract, left eye: Secondary | ICD-10-CM | POA: Diagnosis not present

## 2018-04-17 DIAGNOSIS — H2512 Age-related nuclear cataract, left eye: Secondary | ICD-10-CM | POA: Diagnosis not present

## 2018-04-17 DIAGNOSIS — H401123 Primary open-angle glaucoma, left eye, severe stage: Secondary | ICD-10-CM | POA: Diagnosis not present

## 2018-04-17 DIAGNOSIS — H401111 Primary open-angle glaucoma, right eye, mild stage: Secondary | ICD-10-CM | POA: Diagnosis not present

## 2018-04-17 DIAGNOSIS — H268 Other specified cataract: Secondary | ICD-10-CM | POA: Diagnosis not present

## 2018-04-30 DIAGNOSIS — H2512 Age-related nuclear cataract, left eye: Secondary | ICD-10-CM | POA: Diagnosis not present

## 2018-04-30 DIAGNOSIS — H25812 Combined forms of age-related cataract, left eye: Secondary | ICD-10-CM | POA: Diagnosis not present

## 2018-04-30 DIAGNOSIS — H401123 Primary open-angle glaucoma, left eye, severe stage: Secondary | ICD-10-CM | POA: Diagnosis not present

## 2018-05-13 DIAGNOSIS — J40 Bronchitis, not specified as acute or chronic: Secondary | ICD-10-CM | POA: Diagnosis not present

## 2018-06-10 DIAGNOSIS — R001 Bradycardia, unspecified: Secondary | ICD-10-CM | POA: Diagnosis not present

## 2018-07-16 DIAGNOSIS — Z0000 Encounter for general adult medical examination without abnormal findings: Secondary | ICD-10-CM | POA: Diagnosis not present

## 2018-07-16 DIAGNOSIS — E1121 Type 2 diabetes mellitus with diabetic nephropathy: Secondary | ICD-10-CM | POA: Diagnosis not present

## 2018-07-16 DIAGNOSIS — I5022 Chronic systolic (congestive) heart failure: Secondary | ICD-10-CM | POA: Diagnosis not present

## 2018-07-16 DIAGNOSIS — N184 Chronic kidney disease, stage 4 (severe): Secondary | ICD-10-CM | POA: Diagnosis not present

## 2018-10-08 DIAGNOSIS — R404 Transient alteration of awareness: Secondary | ICD-10-CM | POA: Diagnosis not present

## 2018-10-08 DIAGNOSIS — R0602 Shortness of breath: Secondary | ICD-10-CM | POA: Diagnosis not present

## 2018-10-08 DIAGNOSIS — R069 Unspecified abnormalities of breathing: Secondary | ICD-10-CM | POA: Diagnosis not present

## 2018-10-08 DIAGNOSIS — R4020 Unspecified coma: Secondary | ICD-10-CM | POA: Diagnosis not present

## 2018-10-08 DIAGNOSIS — I499 Cardiac arrhythmia, unspecified: Secondary | ICD-10-CM | POA: Diagnosis not present

## 2018-10-24 DEATH — deceased
